# Patient Record
Sex: Female | Born: 1975 | Race: White | Hispanic: No | Marital: Married | Smoking: Never smoker
Health system: Southern US, Community
[De-identification: ages and names within clinical notes are randomized; demographics above are authoritative.]

## PROBLEM LIST (undated history)

## (undated) DIAGNOSIS — O24419 Gestational diabetes mellitus in pregnancy, unspecified control: Secondary | ICD-10-CM

## (undated) DIAGNOSIS — L709 Acne, unspecified: Secondary | ICD-10-CM

## (undated) DIAGNOSIS — H52209 Unspecified astigmatism, unspecified eye: Secondary | ICD-10-CM

## (undated) DIAGNOSIS — E669 Obesity, unspecified: Secondary | ICD-10-CM

## (undated) DIAGNOSIS — E282 Polycystic ovarian syndrome: Secondary | ICD-10-CM

## (undated) DIAGNOSIS — H5210 Myopia, unspecified eye: Secondary | ICD-10-CM

## (undated) HISTORY — DX: Acne, unspecified: L70.9

## (undated) HISTORY — DX: Gestational diabetes mellitus in pregnancy, unspecified control: O24.419

## (undated) HISTORY — DX: Obesity, unspecified: E66.9

## (undated) HISTORY — DX: Polycystic ovarian syndrome: E28.2

## (undated) HISTORY — DX: Myopia, unspecified eye: H52.10

## (undated) HISTORY — DX: Unspecified astigmatism, unspecified eye: H52.209

---

## 2002-03-13 HISTORY — PX: DILATION AND CURETTAGE OF UTERUS: SHX78

## 2009-06-29 ENCOUNTER — Ambulatory Visit: Payer: Self-pay

## 2009-07-16 ENCOUNTER — Encounter: Payer: Self-pay | Admitting: Obstetrics & Gynecology

## 2009-08-11 HISTORY — PX: TUBAL LIGATION: SHX77

## 2009-08-17 ENCOUNTER — Ambulatory Visit: Payer: Self-pay | Admitting: Unknown Physician Specialty

## 2009-08-18 ENCOUNTER — Inpatient Hospital Stay: Payer: Self-pay | Admitting: Unknown Physician Specialty

## 2016-09-21 ENCOUNTER — Other Ambulatory Visit: Payer: Self-pay | Admitting: Certified Nurse Midwife

## 2016-09-21 DIAGNOSIS — Z1231 Encounter for screening mammogram for malignant neoplasm of breast: Secondary | ICD-10-CM

## 2016-11-08 ENCOUNTER — Encounter: Payer: Self-pay | Admitting: Certified Nurse Midwife

## 2016-11-08 ENCOUNTER — Ambulatory Visit
Admission: RE | Admit: 2016-11-08 | Discharge: 2016-11-08 | Disposition: A | Discharge status: Home / Self Care | Payer: BC Managed Care – PPO | Source: Ambulatory Visit | Attending: Certified Nurse Midwife | Admitting: Certified Nurse Midwife

## 2016-11-08 ENCOUNTER — Ambulatory Visit (INDEPENDENT_AMBULATORY_CARE_PROVIDER_SITE_OTHER): Payer: BC Managed Care – PPO | Admitting: Certified Nurse Midwife

## 2016-11-08 VITALS — BP 122/82 | HR 88 | Ht 60.0 in | Wt 202.0 lb

## 2016-11-08 DIAGNOSIS — E669 Obesity, unspecified: Secondary | ICD-10-CM | POA: Diagnosis not present

## 2016-11-08 DIAGNOSIS — N923 Ovulation bleeding: Secondary | ICD-10-CM

## 2016-11-08 DIAGNOSIS — Z124 Encounter for screening for malignant neoplasm of cervix: Secondary | ICD-10-CM

## 2016-11-08 DIAGNOSIS — Z1231 Encounter for screening mammogram for malignant neoplasm of breast: Secondary | ICD-10-CM | POA: Insufficient documentation

## 2016-11-08 DIAGNOSIS — E282 Polycystic ovarian syndrome: Secondary | ICD-10-CM | POA: Diagnosis not present

## 2016-11-08 DIAGNOSIS — Z8632 Personal history of gestational diabetes: Secondary | ICD-10-CM | POA: Diagnosis not present

## 2016-11-08 DIAGNOSIS — Z01419 Encounter for gynecological examination (general) (routine) without abnormal findings: Secondary | ICD-10-CM

## 2016-11-08 NOTE — Progress Notes (Signed)
Gynecology Annual Exam  PCP: Patient, No Pcp Per  Chief Complaint:  Chief Complaint  Patient presents with  . Gynecologic Exam    History of Present Illness:Joniece D. Wah presents today for her annual exam. She is a 41 year old Caucasian/White female, G3 P2012, whose LMP was 10/03/2016. She is having no significant GYN problems.  Her menses are usually regular. They occur every  month , they last 4 days , are medium flow, and are without clots.  She has had some spotting/ brown discharge about once a month x1 day usually midcycle. Denies itching or irritation accompanying the discharge.  She denies dysmenorrhea.  The patient's past medical history is notable for PCOS, obesity, and GDM. Since her last annual GYN exam dated 10/09/2015, she has had no significant changes in her health history.  She is sexually active. She is currently using permanent sterilization for contraception.  Her most recent pap smear was obtained 10/09/2015 and was NIL/ neg HRHPV Her most recent mammogram obtained today and results are pending. Last prior mammogram was 11/09/2015 was negative. There is a positive history of breast cancer in her maternal grandmother and paternal grandmother. Genetic testing has not been done.  There is no family history of ovarian cancer.  The patient does do monthly self breast exams.  The patient does not smoke.  The patient does drink infrequently.  The patient does not use illegal drugs.  The patient exercises three times a week at the gym and walks the track.  The patient does get adequate calcium in her diet.  She had a recent cholesterol screen in 2015 that was normal.    The patient denies current symptoms of depression.    Review of Systems: Review of Systems  Constitutional: Negative for chills, fever and weight loss.       Positive for weight gain of 10# in 1 year.  HENT: Negative for congestion, sinus pain and sore throat.   Eyes: Negative for blurred vision  and pain.  Respiratory: Negative for hemoptysis, shortness of breath and wheezing.   Cardiovascular: Negative for chest pain, palpitations and leg swelling.  Gastrointestinal: Negative for abdominal pain, blood in stool, diarrhea, heartburn, nausea and vomiting.  Genitourinary: Negative for dysuria, frequency, hematuria and urgency.  Musculoskeletal: Negative for back pain, joint pain and myalgias.  Skin: Negative for itching and rash.  Neurological: Negative for dizziness, tingling and headaches.  Endo/Heme/Allergies: Negative for environmental allergies and polydipsia. Does not bruise/bleed easily.       Positive  for hirsutism and acne   Psychiatric/Behavioral: Negative for depression. The patient is not nervous/anxious and does not have insomnia.     Past Medical History:  Past Medical History:  Diagnosis Date  . Gestational diabetes   . Obesity (BMI 35.0-39.9 without comorbidity)   . PCOS (polycystic ovarian syndrome)     Past Surgical History:  Past Surgical History:  Procedure Laterality Date  . CESAREAN SECTION     2003,2011  . DILATION AND CURETTAGE OF UTERUS  03/2002  . TUBAL LIGATION  08/2009    Family History:  Family History  Problem Relation Age of Onset  . Breast cancer Maternal Grandmother        60's  . Breast cancer Paternal Grandmother        early 43's  . Lung cancer Mother 46  . Hypertension Maternal Uncle   . Melanoma Maternal Grandfather        deceased in his 39s  .  Leukemia Paternal Grandfather [id number]    Social History:  Social History   Social History  . Marital status: Married    Spouse name: Aurther Lofterry  . Number of children: 2  . Years of education: 16   Occupational History  . Teacher    Social History Main Topics  . Smoking status: Never Smoker  . Smokeless tobacco: Never Used  . Alcohol use Yes     Comment: occasional  . Drug use: No  . Sexual activity: Yes    Birth control/ protection: Surgical   Other Topics Concern  . Not on  file   Social History Narrative  . No narrative on file    Allergies:  No Known Allergies  Medications:  Current Outpatient Prescriptions:  .  calcium-vitamin D (OSCAL WITH D) 500-200 MG-UNIT tablet, Take 1 tablet by mouth daily with breakfast., Disp: , Rfl:  .  Multiple Vitamin (MULTIVITAMIN) tablet, Take 1 tablet by mouth daily., Disp: , Rfl:  Essential oils   Physical Exam Vitals: Blood pressure 122/82, pulse 88, height 5' (1.524 m), weight 202 lb (91.6 kg), BMI 39.45 kg/m2 last menstrual period 10/03/2016.  General: pleasant WF in NAD HEENT: normocephalic, anicteric  Face: hirsutism of upper lip, chin/ acne chin and neck Neck: no thyroid enlargement, no palpable nodules, no cervical lymphadenopathy  Pulmonary: No increased work of breathing, CTAB Cardiovascular: RRR, without murmur  Breast: Breast symmetrical, no tenderness, no palpable nodules or masses, no skin or nipple retraction present, no nipple discharge.  No axillary, infraclavicular or supraclavicular lymphadenopathy. Abdomen: Soft, non-tender, non-distended.  Umbilicus without lesions.  No hepatomegaly or masses palpable. No evidence of hernia. Genitourinary:  External: Normal external female genitalia.  Normal urethral meatus, normal  Bartholin's and Skene's glands.    Vagina: Normal vaginal mucosa, no evidence of prolapse.    Cervix: Grossly normal in appearance, no bleeding, nullip, non-tender  Uterus: Anteverted, normal size, shape, and consistency, mobile, and non-tender  Adnexa: No adnexal masses, non-tender (diificult exam due to obesity)  Rectal: deferred  Lymphatic: no evidence of inguinal lymphadenopathy Extremities: no edema, erythema, or tenderness Neurologic: Grossly intact Psychiatric: mood appropriate, affect full     Assessment: 41 y.o. [id number] well woman exam PCOS Intermenstrual spotting-midcycle, possibly related to ovulation  Plan:  1) Breast cancer screening - recommend monthly self  breast exams and annual mammograms. Mammogram done today  2) Menstrual calendar-to see if intermenstrual spotting is consistently 2 weeks before next menses starts. To let me know if this is not the case.  3) Cervical cancer screening - Pap was done. ASCCP guidelines and rational discussed.  Patient opts for yearly screening interval  4) Routine healthcare maintenance including cholesterol and diabetes screening UTD   5) RTO 1 year for annual and prn  Farrel Connersolleen Adryana Mogensen, CNM

## 2016-11-11 LAB — IGP,RFX APTIMA HPV ALL PTH: PAP SMEAR COMMENT: 0

## 2016-11-14 ENCOUNTER — Inpatient Hospital Stay
Admission: RE | Admit: 2016-11-14 | Discharge: 2016-11-14 | Disposition: A | Discharge status: Home / Self Care | Payer: Self-pay | Source: Ambulatory Visit | Attending: *Deleted | Admitting: *Deleted

## 2016-11-14 ENCOUNTER — Other Ambulatory Visit: Payer: Self-pay | Admitting: *Deleted

## 2016-11-14 DIAGNOSIS — Z9289 Personal history of other medical treatment: Secondary | ICD-10-CM

## 2017-12-12 ENCOUNTER — Other Ambulatory Visit: Payer: Self-pay | Admitting: Certified Nurse Midwife

## 2017-12-12 DIAGNOSIS — Z1231 Encounter for screening mammogram for malignant neoplasm of breast: Secondary | ICD-10-CM

## 2018-01-02 ENCOUNTER — Ambulatory Visit
Admission: RE | Admit: 2018-01-02 | Discharge: 2018-01-02 | Disposition: A | Discharge status: Home / Self Care | Payer: 59 | Source: Ambulatory Visit | Attending: Certified Nurse Midwife | Admitting: Certified Nurse Midwife

## 2018-01-02 DIAGNOSIS — Z1231 Encounter for screening mammogram for malignant neoplasm of breast: Secondary | ICD-10-CM | POA: Diagnosis present

## 2018-01-03 ENCOUNTER — Other Ambulatory Visit: Payer: Self-pay | Admitting: Certified Nurse Midwife

## 2018-01-03 DIAGNOSIS — N6310 Unspecified lump in the right breast, unspecified quadrant: Secondary | ICD-10-CM

## 2018-01-03 DIAGNOSIS — R928 Other abnormal and inconclusive findings on diagnostic imaging of breast: Secondary | ICD-10-CM

## 2018-01-10 ENCOUNTER — Ambulatory Visit
Admission: RE | Admit: 2018-01-10 | Discharge: 2018-01-10 | Disposition: A | Discharge status: Home / Self Care | Payer: 59 | Source: Ambulatory Visit | Attending: Certified Nurse Midwife | Admitting: Certified Nurse Midwife

## 2018-01-10 DIAGNOSIS — N6310 Unspecified lump in the right breast, unspecified quadrant: Secondary | ICD-10-CM

## 2018-01-10 DIAGNOSIS — R928 Other abnormal and inconclusive findings on diagnostic imaging of breast: Secondary | ICD-10-CM | POA: Diagnosis not present

## 2018-01-12 ENCOUNTER — Ambulatory Visit (INDEPENDENT_AMBULATORY_CARE_PROVIDER_SITE_OTHER): Payer: BC Managed Care – PPO | Admitting: Certified Nurse Midwife

## 2018-01-12 ENCOUNTER — Other Ambulatory Visit (HOSPITAL_COMMUNITY)
Admission: RE | Admit: 2018-01-12 | Discharge: 2018-01-12 | Disposition: A | Discharge status: Home / Self Care | Payer: 59 | Source: Ambulatory Visit | Attending: Obstetrics and Gynecology | Admitting: Obstetrics and Gynecology

## 2018-01-12 ENCOUNTER — Encounter: Payer: Self-pay | Admitting: Certified Nurse Midwife

## 2018-01-12 VITALS — BP 140/84 | HR 84 | Ht 60.0 in | Wt 202.2 lb

## 2018-01-12 DIAGNOSIS — Z01419 Encounter for gynecological examination (general) (routine) without abnormal findings: Secondary | ICD-10-CM | POA: Diagnosis not present

## 2018-01-12 DIAGNOSIS — Z124 Encounter for screening for malignant neoplasm of cervix: Secondary | ICD-10-CM

## 2018-01-12 DIAGNOSIS — L680 Hirsutism: Secondary | ICD-10-CM | POA: Diagnosis not present

## 2018-01-12 DIAGNOSIS — N6310 Unspecified lump in the right breast, unspecified quadrant: Secondary | ICD-10-CM | POA: Diagnosis not present

## 2018-01-12 DIAGNOSIS — Z01411 Encounter for gynecological examination (general) (routine) with abnormal findings: Secondary | ICD-10-CM | POA: Diagnosis not present

## 2018-01-12 NOTE — Progress Notes (Addendum)
Gynecology Annual Exam  PCP: [facility], [place]  Chief Complaint:  Chief Complaint  Patient presents with  . Gynecologic Exam    History of Present Illness:Danielle Mitchell presents today for her annual exam. She is a 42 year old Caucasian/White female, G3 P2012, whose LMP was 12/31/2017. She is having no significant GYN problems.  Her menses are usually regular. They occur every  month , they last 3-4 days , are medium flow, and are with occ small clots.  She has some discharge at times. Denies itching or irritation or odor accompanying the discharge.  She denies dysmenorrhea.  The patient's past medical history is notable for PCOS, obesity, and GDM. Since her last annual GYN exam dated 11/08/2016 , she has had no significant changes in her health history.  She is sexually active. She is currently using permanent sterilization for contraception.  Her most recent pap smear was obtained 11/08/2016  and was NIL. Her most recent mammogram 01/02/2018 was Birads 0. Additional views and ultrasound of the right breast revealed a 6x8x5mm mass at 6 o'clock-most likely a cyst. Repeat diagnostic mammogram and ultrasound recommended in 6 mos.  There is a positive history of breast cancer in her maternal grandmother and paternal grandmother. Genetic testing has not been done.  There is no family history of ovarian cancer.  The patient does do monthly self breast exams.  The patient does not smoke.  The patient does drink infrequently.  The patient does not use illegal drugs.  The patient normally exercises three times a week at the gym and walks the track. She has not been as active the past 2 months, but is wanting to get back to exercising soon. The patient does get adequate calcium in her diet.  She had a recent cholesterol screen in 2015 that was normal.    The patient denies current symptoms of depression.    Review of Systems: Review of Systems  Constitutional: Negative for chills,  fever and weight loss.  HENT: Negative for congestion, sinus pain and sore throat.   Eyes: Negative for blurred vision and pain.  Respiratory: Negative for hemoptysis, shortness of breath and wheezing.   Cardiovascular: Negative for chest pain, palpitations and leg swelling.  Gastrointestinal: Negative for abdominal pain, blood in stool, diarrhea, heartburn, nausea and vomiting.  Genitourinary: Negative for dysuria, frequency, hematuria and urgency.  Musculoskeletal: Negative for back pain, joint pain and myalgias.  Skin: Negative for itching and rash.  Neurological: Negative for dizziness, tingling and headaches.  Endo/Heme/Allergies: Negative for environmental allergies and polydipsia. Does not bruise/bleed easily.       Positive  for hirsutism and acne   Psychiatric/Behavioral: Negative for depression. The patient is not nervous/anxious and does not have insomnia.     Past Medical History:  Past Medical History:  Diagnosis Date  . Acne   . Astigmatism   . Gestational diabetes   . Nearsightedness    glasses  . Obesity (BMI 35.0-39.9 without comorbidity)   . PCOS (polycystic ovarian syndrome)     Past Surgical History:  Past Surgical History:  Procedure Laterality Date  . CESAREAN SECTION     2003,2011  . DILATION AND CURETTAGE OF UTERUS  03/2002  . TUBAL LIGATION  08/2009    Family History:  Family History  Problem Relation Age of Onset  . Breast cancer Maternal Grandmother        60's  . Breast cancer Paternal Grandmother  early 55's; unsure of primary-found in lung  . Lung cancer Mother 45  . Cancer Mother        skin  . Hypertension Maternal Uncle   . Melanoma Maternal Grandfather        deceased in his 13s  . Leukemia Paternal Grandfather 8    Social History:  Social History   Socioeconomic History  . Marital status: Married    Spouse name: Aurther Loft  . Number of children: 2  . Years of education: 16  . Highest education level: Bachelor's degree  (e.g., BA, AB, BS)  Occupational History  . Occupation: Teacher    Comment: [employer]  Social Needs  . Financial resource strain: Not on file  . Food insecurity:    Worry: Not on file    Inability: Not on file  . Transportation needs:    Medical: Not on file    Non-medical: Not on file  Tobacco Use  . Smoking status: Never Smoker  . Smokeless tobacco: Never Used  Substance and Sexual Activity  . Alcohol use: Yes    Comment: occasional  . Drug use: No  . Sexual activity: Yes    Partners: Male    Birth control/protection: Surgical  Lifestyle  . Physical activity:    Days per week: Not on file    Minutes per session: Not on file  . Stress: Not on file  Relationships  . Social connections:    Talks on phone: Not on file    Gets together: Not on file    Attends religious service: Not on file    Active member of club or organization: Not on file    Attends meetings of clubs or organizations: Not on file    Relationship status: Not on file  . Intimate partner violence:    Fear of current or ex partner: Not on file    Emotionally abused: Not on file    Physically abused: Not on file    Forced sexual activity: Not on file  Other Topics Concern  . Not on file  Social History Narrative  . Not on file    Allergies:  No Known Allergies  Medications: Essential oils   Physical Exam Vitals: BP 140/84   Pulse 84   Ht 5' (1.524 m)   Wt 202 lb 4 oz (91.7 kg)   LMP 12/31/2017   BMI 39.50 kg/m     General: pleasant WF in NAD HEENT: normocephalic, anicteric  Face: hirsutism of upper lip, chin/ acne chin and neck Neck: no thyroid enlargement, no palpable nodules, no cervical lymphadenopathy  Pulmonary: No increased work of breathing, CTAB Cardiovascular: RRR, with Grade II/VI systolic murmur in all areas Breast: Breast symmetrical, no tenderness, no palpable nodules or masses, no skin or nipple retraction present, no nipple discharge.  No axillary, infraclavicular or  supraclavicular lymphadenopathy. Abdomen: Soft, non-tender, non-distended.  Umbilicus without lesions.  No hepatomegaly or masses palpable. No evidence of hernia. Genitourinary:  External: Normal external female genitalia.  Normal urethral meatus, normal  Bartholin's and Skene's glands.    Vagina: Normal vaginal mucosa, no evidence of prolapse.    Cervix: Grossly normal in appearance, no bleeding, nullip, non-tender  Uterus: Anteverted, normal size, shape, and consistency, mobile, and non-tender  Adnexa: No adnexal masses, non-tender (diificult exam due to obesity)  Rectal: deferred  Lymphatic: no evidence of inguinal lymphadenopathy Extremities: no edema, erythema, or tenderness Neurologic: Grossly intact Psychiatric: mood appropriate, affect full     Assessment: 42  y.o. [id number] well woman exam PCOS Birads 3 mammogram-probable right breast cyst   Plan:  1) Breast cancer screening - recommend monthly self breast exams. Repeat right diagnostic mammogram and ultrasound in 6 months.  2) Cervical cancer screening - Pap was done. ASCCP guidelines and rational discussed.  Patient opts for yearly screening interval  3) Routine healthcare maintenance including cholesterol and diabetes screening UTD . Screening due next year.  4) RTO 1 year for annual and prn  Farrel Connersolleen Kateline Kinkade, CNM

## 2018-01-13 ENCOUNTER — Encounter: Payer: Self-pay | Admitting: Certified Nurse Midwife

## 2018-01-16 LAB — CYTOLOGY - PAP: DIAGNOSIS: NEGATIVE

## 2018-03-03 ENCOUNTER — Encounter: Payer: Self-pay | Admitting: Emergency Medicine

## 2018-03-03 ENCOUNTER — Other Ambulatory Visit: Payer: Self-pay

## 2018-03-03 ENCOUNTER — Emergency Department
Admission: EM | Admit: 2018-03-03 | Discharge: 2018-03-03 | Disposition: A | Discharge status: Home / Self Care | Payer: BC Managed Care – PPO | Attending: Student in an Organized Health Care Education/Training Program | Admitting: Student in an Organized Health Care Education/Training Program

## 2018-03-03 DIAGNOSIS — Y9389 Activity, other specified: Secondary | ICD-10-CM | POA: Diagnosis not present

## 2018-03-03 DIAGNOSIS — S50811A Abrasion of right forearm, initial encounter: Secondary | ICD-10-CM | POA: Diagnosis not present

## 2018-03-03 DIAGNOSIS — S50812A Abrasion of left forearm, initial encounter: Secondary | ICD-10-CM | POA: Insufficient documentation

## 2018-03-03 DIAGNOSIS — S59911A Unspecified injury of right forearm, initial encounter: Secondary | ICD-10-CM | POA: Insufficient documentation

## 2018-03-03 DIAGNOSIS — Z23 Encounter for immunization: Secondary | ICD-10-CM | POA: Insufficient documentation

## 2018-03-03 DIAGNOSIS — Y92410 Unspecified street and highway as the place of occurrence of the external cause: Secondary | ICD-10-CM | POA: Diagnosis not present

## 2018-03-03 DIAGNOSIS — S1081XA Abrasion of other specified part of neck, initial encounter: Secondary | ICD-10-CM | POA: Diagnosis not present

## 2018-03-03 DIAGNOSIS — T07XXXA Unspecified multiple injuries, initial encounter: Secondary | ICD-10-CM

## 2018-03-03 DIAGNOSIS — S59912A Unspecified injury of left forearm, initial encounter: Secondary | ICD-10-CM | POA: Diagnosis present

## 2018-03-03 DIAGNOSIS — Y999 Unspecified external cause status: Secondary | ICD-10-CM | POA: Insufficient documentation

## 2018-03-03 MED ORDER — BACITRACIN-NEOMYCIN-POLYMYXIN 400-5-5000 EX OINT: TOPICAL_OINTMENT | Freq: Once | CUTANEOUS | Status: DC

## 2018-03-03 MED ORDER — BACITRACIN ZINC 500 UNIT/GM EX OINT: TOPICAL_OINTMENT | Freq: Once | CUTANEOUS | Status: DC

## 2018-03-03 MED ORDER — BACITRACIN-NEOMYCIN-POLYMYXIN 400-5-5000 EX OINT
TOPICAL_OINTMENT | CUTANEOUS | Status: AC
  Filled 2018-03-03: qty 1

## 2018-03-03 MED ORDER — TETANUS-DIPHTH-ACELL PERTUSSIS 5-2.5-18.5 LF-MCG/0.5 IM SUSP
0.5000 mL | Freq: Once | INTRAMUSCULAR | Status: AC
  Administered 2018-03-03: 0.5 mL via INTRAMUSCULAR
  Filled 2018-03-03: qty 0.5

## 2018-03-03 MED ORDER — BACIT-POLY-NEO HC 1 % EX OINT: TOPICAL_OINTMENT | Freq: Once | CUTANEOUS | Status: DC

## 2018-03-03 NOTE — ED Notes (Signed)
Neosporin applied to abrasions.
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
## 2018-03-03 NOTE — Discharge Instructions (Addendum)
Follow-up with your primary care provider or [facility] if any continued problems.  Clean the areas with mild soap and water and watch for any signs of infection.  You may apply a small amount of Neosporin or bacitracin to the abrasions for the next 1 to 2 days.  After that allow areas to air dry.  You may take Tylenol or ibuprofen as needed for muscle aches or pain.  You may also apply ice packs to your forearms which will help with the abrasions and pain as a result of the abrasions.
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
## 2018-06-14 ENCOUNTER — Other Ambulatory Visit: Payer: Self-pay | Admitting: Certified Nurse Midwife

## 2018-06-14 DIAGNOSIS — N6001 Solitary cyst of right breast: Secondary | ICD-10-CM

## 2018-06-14 DIAGNOSIS — R928 Other abnormal and inconclusive findings on diagnostic imaging of breast: Secondary | ICD-10-CM

## 2018-06-20 ENCOUNTER — Telehealth: Payer: Self-pay

## 2018-06-20 NOTE — Telephone Encounter (Signed)
Pt needs additional testing at [facility].  Needs order sent.  [id number]

## 2018-06-20 NOTE — Telephone Encounter (Signed)
Orders are in. Left message for patient to call [facility] to schedule right diagnostic mammogram and ultrasound

## 2018-07-20 ENCOUNTER — Ambulatory Visit
Admission: RE | Admit: 2018-07-20 | Discharge: 2018-07-20 | Disposition: A | Discharge status: Home / Self Care | Payer: BC Managed Care – PPO | Source: Ambulatory Visit | Attending: Certified Nurse Midwife | Admitting: Certified Nurse Midwife

## 2018-07-20 DIAGNOSIS — N6001 Solitary cyst of right breast: Secondary | ICD-10-CM

## 2018-07-20 DIAGNOSIS — R928 Other abnormal and inconclusive findings on diagnostic imaging of breast: Secondary | ICD-10-CM | POA: Diagnosis present

## 2018-07-23 ENCOUNTER — Other Ambulatory Visit: Payer: Self-pay | Admitting: Certified Nurse Midwife

## 2018-07-23 DIAGNOSIS — N6310 Unspecified lump in the right breast, unspecified quadrant: Secondary | ICD-10-CM

## 2018-07-23 DIAGNOSIS — Z1231 Encounter for screening mammogram for malignant neoplasm of breast: Secondary | ICD-10-CM

## 2018-10-04 IMAGING — US US BREAST*R* LIMITED INC AXILLA
1 series · 5 of 5 positions shown · non-contrast
Comparison: Previous exam(s).

CLINICAL DATA: The patient was called back for a right breast mass.

EXAM:
DIGITAL DIAGNOSTIC RIGHT MAMMOGRAM
ULTRASOUND RIGHT BREAST

[Series 1: us breast*right* limited inc axilla · 0.06mm/px · 5 of 5 slices shown]
[im 1/5]
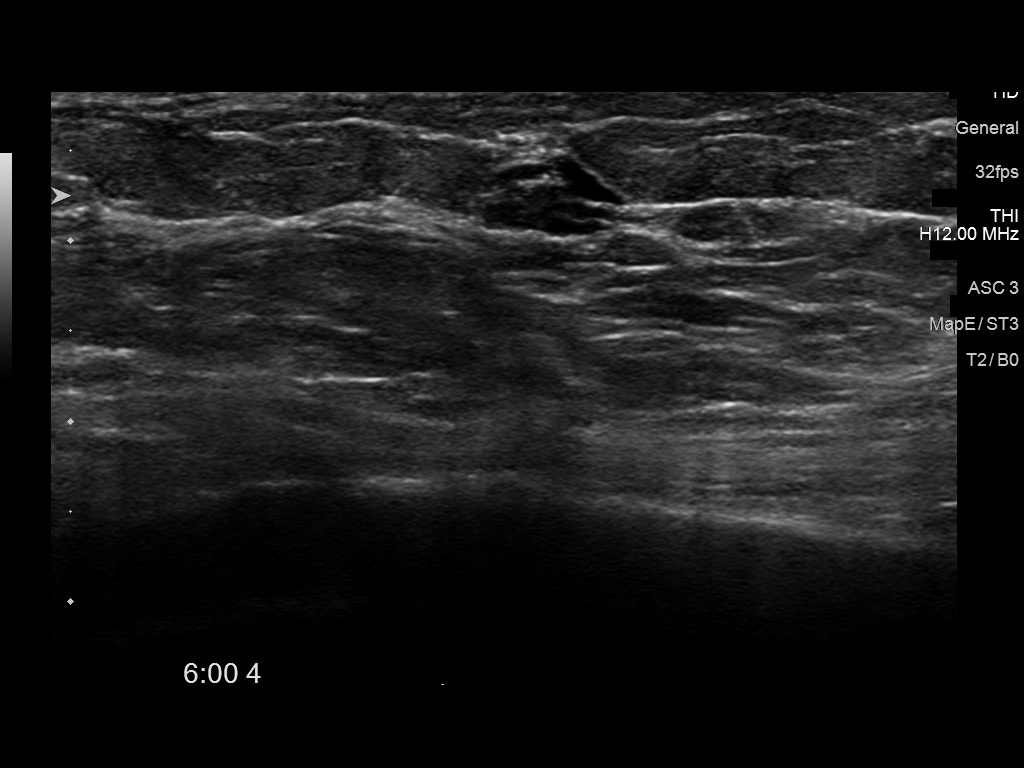
[im 2/5]
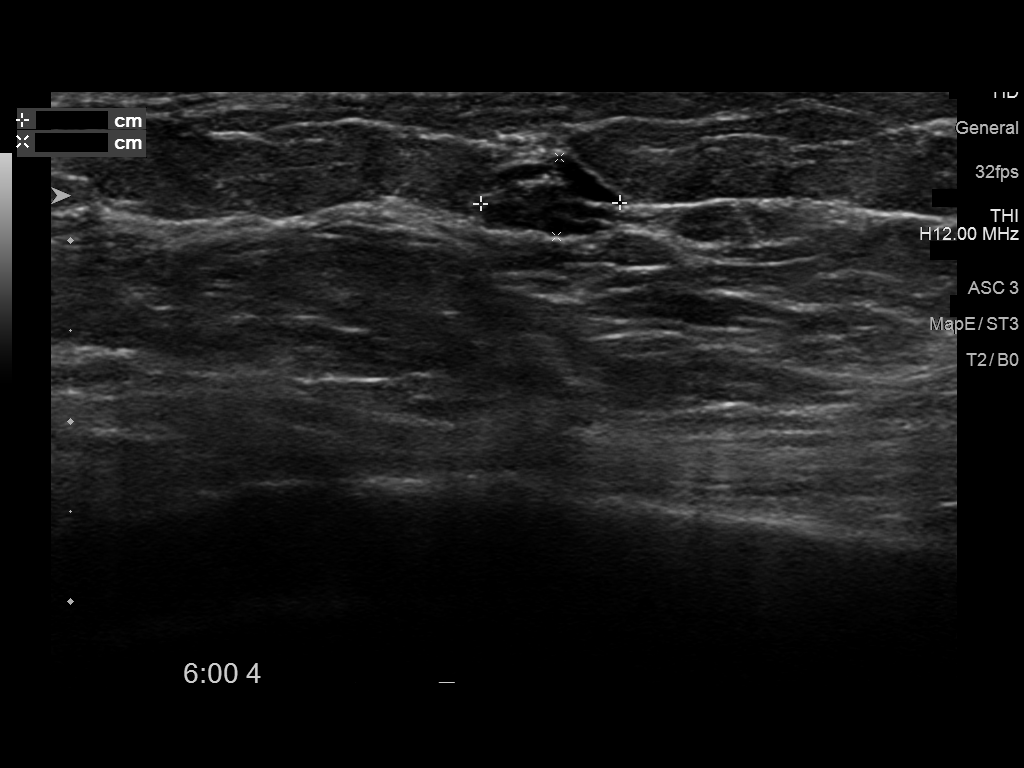
[im 3/5]
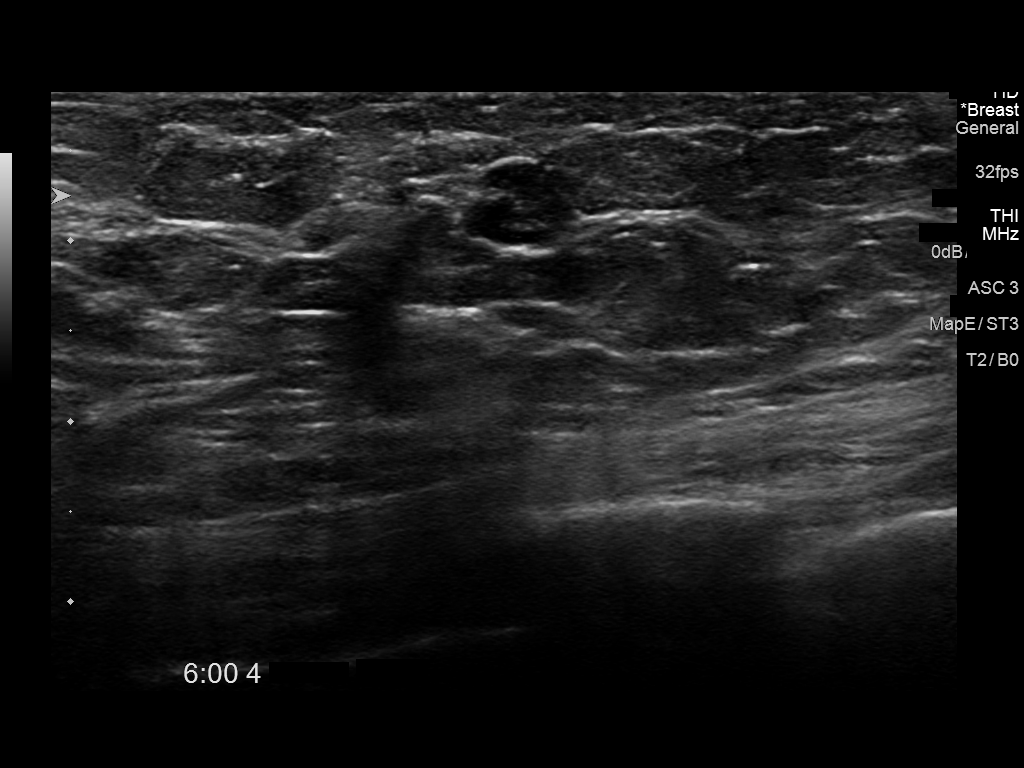
[im 4/5]
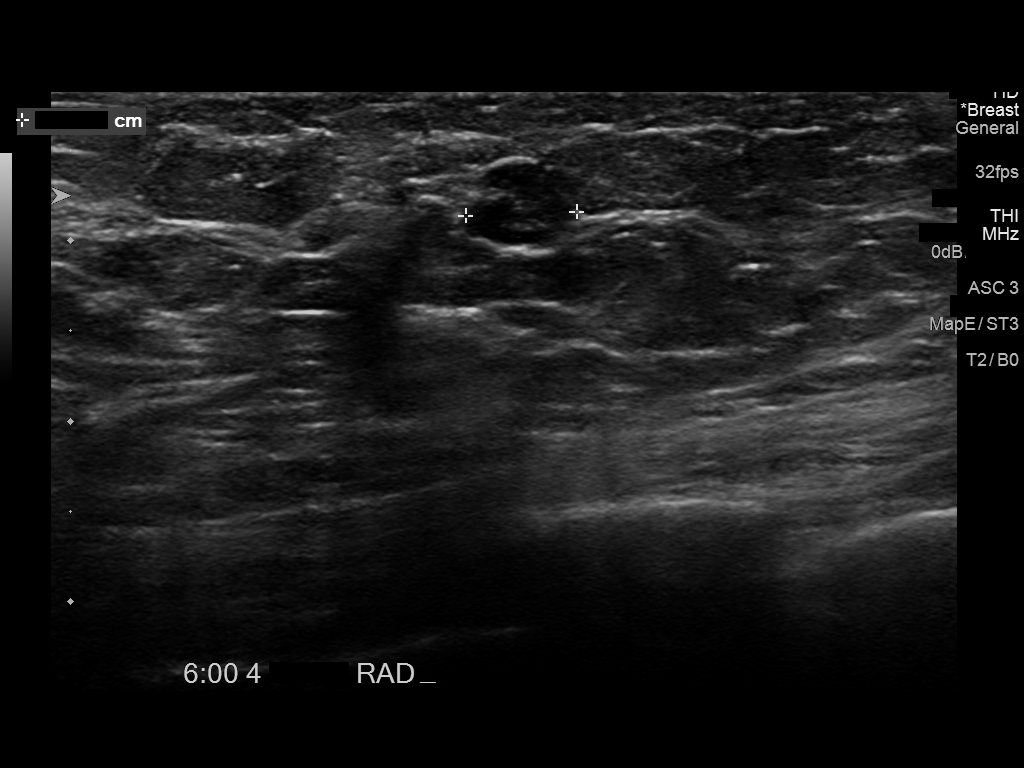
[im 5/5]
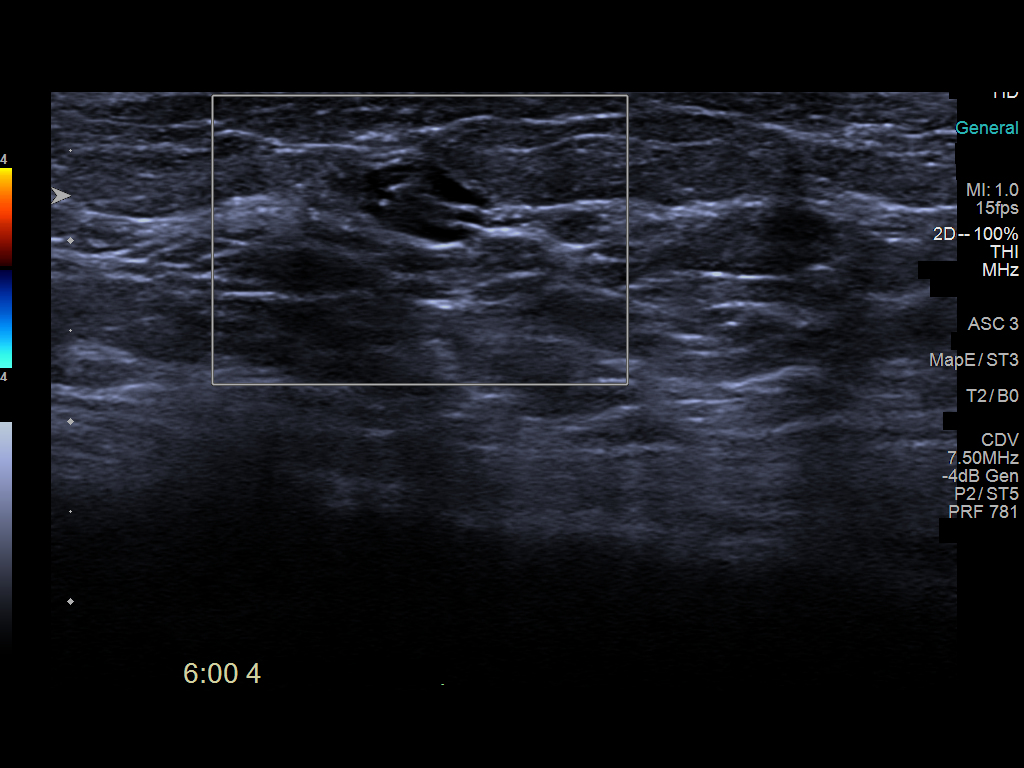

[5 of 5 positions shown; findings below may reference images not displayed]

ACR Breast Density Category b: There are scattered areas of
fibroglandular density.
FINDINGS: The mass in the inferior right breast persists on additional
imaging.

On physical exam, no suspicious lumps identified.

Targeted ultrasound is performed, showing a probable cluster of
cysts in the right breast at 6 o'clock, 4 cm from the nipple
measuring 6 x 8 x 4 mm.
IMPRESSION: Probably benign right breast at 6 o'clock in the right breast,
likely a cluster of cysts.

RECOMMENDATION:
Six-month follow-up mammogram and ultrasound of the probably benign
right breast mass.

I have discussed the findings and recommendations with the patient.
Results were also provided in writing at the conclusion of the
visit. If applicable, a reminder letter will be sent to the patient
regarding the next appointment.

BI-RADS CATEGORY  3: Probably benign.

## 2019-01-14 ENCOUNTER — Encounter: Payer: Self-pay | Admitting: Obstetrics and Gynecology

## 2019-01-14 ENCOUNTER — Other Ambulatory Visit: Payer: Self-pay

## 2019-01-14 ENCOUNTER — Ambulatory Visit (INDEPENDENT_AMBULATORY_CARE_PROVIDER_SITE_OTHER): Payer: BC Managed Care – PPO | Admitting: Obstetrics and Gynecology

## 2019-01-14 VITALS — BP 130/86 | HR 87 | Ht 60.0 in | Wt 207.0 lb

## 2019-01-14 DIAGNOSIS — N926 Irregular menstruation, unspecified: Secondary | ICD-10-CM

## 2019-01-14 DIAGNOSIS — Z131 Encounter for screening for diabetes mellitus: Secondary | ICD-10-CM

## 2019-01-14 DIAGNOSIS — Z130 Encounter for screening for diseases of the blood and blood-forming organs and certain disorders involving the immune mechanism: Secondary | ICD-10-CM

## 2019-01-14 DIAGNOSIS — Z13220 Encounter for screening for lipoid disorders: Secondary | ICD-10-CM

## 2019-01-14 DIAGNOSIS — Z01419 Encounter for gynecological examination (general) (routine) without abnormal findings: Secondary | ICD-10-CM | POA: Diagnosis not present

## 2019-01-14 DIAGNOSIS — Z1329 Encounter for screening for other suspected endocrine disorder: Secondary | ICD-10-CM

## 2019-01-14 DIAGNOSIS — Z1239 Encounter for other screening for malignant neoplasm of breast: Secondary | ICD-10-CM

## 2019-01-14 DIAGNOSIS — Z0000 Encounter for general adult medical examination without abnormal findings: Secondary | ICD-10-CM

## 2019-01-14 DIAGNOSIS — N6310 Unspecified lump in the right breast, unspecified quadrant: Secondary | ICD-10-CM

## 2019-01-14 NOTE — Progress Notes (Addendum)
Gynecology Annual Exam  [facility] [place]  Chief Complaint:  Chief Complaint  Patient presents with  . Gynecologic Exam    History of Present Illness: Patient is a 43 y.o. [id number] presents for annual exam. The patient has no complaints today.   LMP: No LMP recorded. Average Interval: regular, 28 days Duration of flow: 3 days Heavy Menses: no Clots: no Intermenstrual Bleeding: yes Postcoital Bleeding: no Dysmenorrhea: no  Reports spotting between periods.    The patient is sexually active. She currently uses tubal ligation for contraception. She denies dyspareunia.  The patient does not perform self breast exams.  There is no notable family history of breast or ovarian cancer in her family.  The patient wears seatbelts: yes.   The patient has regular exercise: walking.    The patient denies current symptoms of depression.    Review of Systems: ROS  Past Medical History:  Past Medical History:  Diagnosis Date  . Acne   . Astigmatism   . Gestational diabetes   . Nearsightedness    glasses  . Obesity (BMI 35.0-39.9 without comorbidity)   . PCOS (polycystic ovarian syndrome)     Past Surgical History:  Past Surgical History:  Procedure Laterality Date  . CESAREAN SECTION     2003,2011  . DILATION AND CURETTAGE OF UTERUS  03/2002  . TUBAL LIGATION  08/2009    Gynecologic History:  No LMP recorded. Contraception: tubal ligation Last Pap: Results were: 2019 NIL  Last mammogram: February  Results were: BI-RAD III  Obstetric History: [id number]: G3P2012  Family History:  Family History  Problem Relation Age of Onset  . Breast cancer Maternal Grandmother        60's  . Breast cancer Paternal Grandmother        early 750's; unsure of primary-found in lung  . Lung cancer Mother [id number]  . Cancer Mother        skin  . Hypertension Maternal Uncle   . Melanoma Maternal Grandfather        deceased in his 3140s  . Leukemia Paternal Grandfather [id number]    Social  History:  Social History   Socioeconomic History  . Marital status: Married    Spouse name: Aurther Lofterry  . Number of children: 2  . Years of education: 16  . Highest education level: Bachelor's degree (e.g., BA, AB, BS)  Occupational History  . Occupation: Teacher    Comment: [employer]  Social Needs  . Financial resource strain: Not on file  . Food insecurity    Worry: Not on file    Inability: Not on file  . Transportation needs    Medical: Not on file    Non-medical: Not on file  Tobacco Use  . Smoking status: Never Smoker  . Smokeless tobacco: Never Used  Substance and Sexual Activity  . Alcohol use: Yes    Comment: occasional  . Drug use: No  . Sexual activity: Yes    Partners: Male    Birth control/protection: Surgical  Lifestyle  . Physical activity    Days per week: Not on file    Minutes per session: Not on file  . Stress: Not on file  Relationships  . Social Musicianconnections    Talks on phone: Not on file    Gets together: Not on file    Attends religious service: Not on file    Active member of club or organization: Not on file    Attends meetings of  clubs or organizations: Not on file    Relationship status: Not on file  . Intimate partner violence    Fear of current or ex partner: Not on file    Emotionally abused: Not on file    Physically abused: Not on file    Forced sexual activity: Not on file  Other Topics Concern  . Not on file  Social History Narrative  . Not on file    Allergies:  No Known Allergies  Medications: Prior to Admission medications   Not on File    Physical Exam Vitals: There were no vitals taken for this visit.  General: NAD HEENT: normocephalic, anicteric Thyroid: no enlargement, no palpable nodules Pulmonary: No increased work of breathing, CTAB Cardiovascular: RRR, distal pulses 2+ Breast: Breast symmetrical, no tenderness, no palpable nodules or masses, no skin or nipple retraction present, no nipple discharge.  No  axillary or supraclavicular lymphadenopathy. Abdomen: NABS, soft, non-tender, non-distended.  Umbilicus without lesions.  No hepatomegaly, splenomegaly or masses palpable. No evidence of hernia  Genitourinary:  External: Normal external female genitalia.  Normal urethral meatus, normal Bartholin's and Skene's glands.    Vagina: Normal vaginal mucosa, no evidence of prolapse.  Dark red/brown blood present on exam.  Cervix: Grossly normal in appearance, no bleeding  Uterus: Non-enlarged, mobile, normal contour.  No CMT  Adnexa: ovaries non-enlarged, no adnexal masses  Rectal: deferred  Lymphatic: no evidence of inguinal lymphadenopathy Extremities: no edema, erythema, or tenderness Neurologic: Grossly intact Psychiatric: mood appropriate, affect full  Female chaperone present for pelvic and breast  portions of the physical exam    Assessment: 43 y.o. F6O1308 routine annual exam  Plan: Problem List Items Addressed This Visit    None    Visit Diagnoses    Encounter for gynecological examination    -  Primary   Mass of right breast on mammogram       Normal pelvic exam       Encounter for screening breast examination          1) Mammogram - recommend yearly screening mammogram.  Mammogram Was ordered today   2) STI screening  was offered and declined  3) ASCCP guidelines and rational discussed.  Patient opts for every 3 years screening interval  4) Contraception - the patient is currently using  tubal ligation.  She is happy with her current form of contraception and plans to continue  5) Colonoscopy -- Screening recommended starting at age 32 for average risk individuals, age 27 for individuals deemed at increased risk (including African Americans) and recommended to continue until age 19.  For patient age 12-85 individualized approach is recommended.  Gold standard screening is via colonoscopy, Cologuard screening is an acceptable alternative for patient unwilling or unable to  undergo colonoscopy.  "Colorectal cancer screening for average?risk adults: 2018 guideline update from the American Cancer Society"CA: A Cancer Journal for Clinicians: Nov 09, 2016   6) Routine healthcare maintenance including cholesterol, diabetes screening discussed To return fasting at a later date  7) Irregular uterine bleeding- will follow up of an ultrasound.   8) Return in about 1 year (around 01/14/2020) for annual.  Adrian Prows MD [facility], [facility] 01/14/2019 12:51 PM

## 2019-01-14 NOTE — Addendum Note (Signed)
Addended by: Adrian Prows on: 01/14/2019 01:41 PM   Modules accepted: Orders

## 2019-01-15 ENCOUNTER — Telehealth: Payer: Self-pay | Admitting: Obstetrics and Gynecology

## 2019-01-15 NOTE — Telephone Encounter (Signed)
Danielle Mitchell        Please scheduled the patient's ultrasound.    Patient states she will call back and schedule once she knows more of when she can come in.

## 2019-03-27 ENCOUNTER — Ambulatory Visit
Admission: RE | Admit: 2019-03-27 | Discharge: 2019-03-27 | Disposition: A | Discharge status: Home / Self Care | Payer: BC Managed Care – PPO | Source: Ambulatory Visit | Attending: Certified Nurse Midwife | Admitting: Certified Nurse Midwife

## 2019-03-27 DIAGNOSIS — Z1231 Encounter for screening mammogram for malignant neoplasm of breast: Secondary | ICD-10-CM | POA: Diagnosis present

## 2019-03-27 DIAGNOSIS — N6310 Unspecified lump in the right breast, unspecified quadrant: Secondary | ICD-10-CM

## 2019-03-28 ENCOUNTER — Encounter: Payer: Self-pay | Admitting: Certified Nurse Midwife

## 2019-08-03 IMAGING — MG MM DIGITAL DIAGNOSTIC UNILAT*R* W/ TOMO W/ CAD
4 series · 4 of 12 positions shown · non-contrast
Comparison: Previous exam(s).

CLINICAL DATA: The patient was called back for a right breast mass.

EXAM:
DIGITAL DIAGNOSTIC RIGHT MAMMOGRAM
ULTRASOUND RIGHT BREAST

[R CC synth-2D]
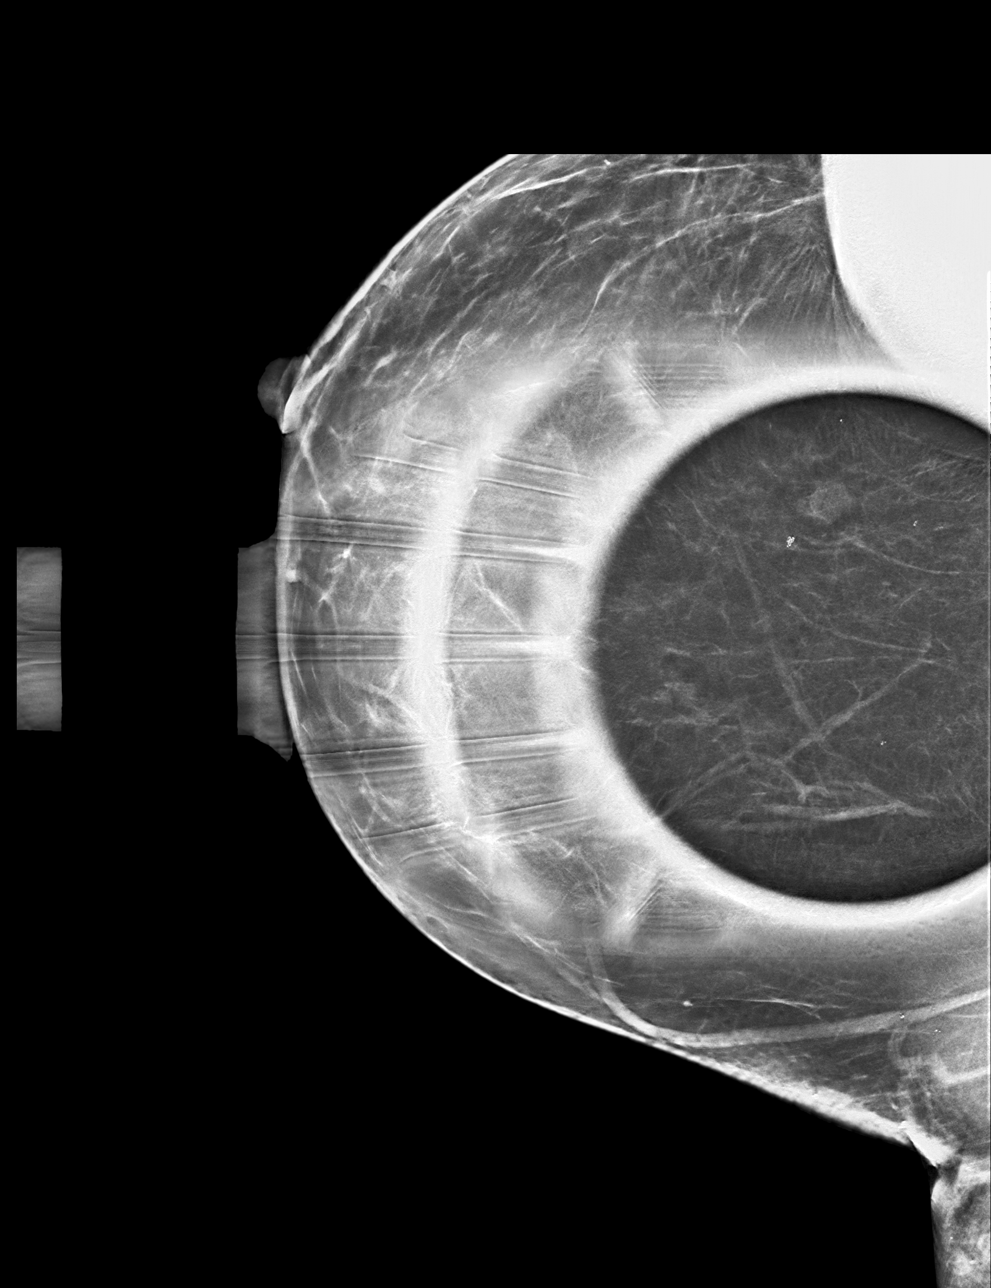

[R MLO synth-2D]
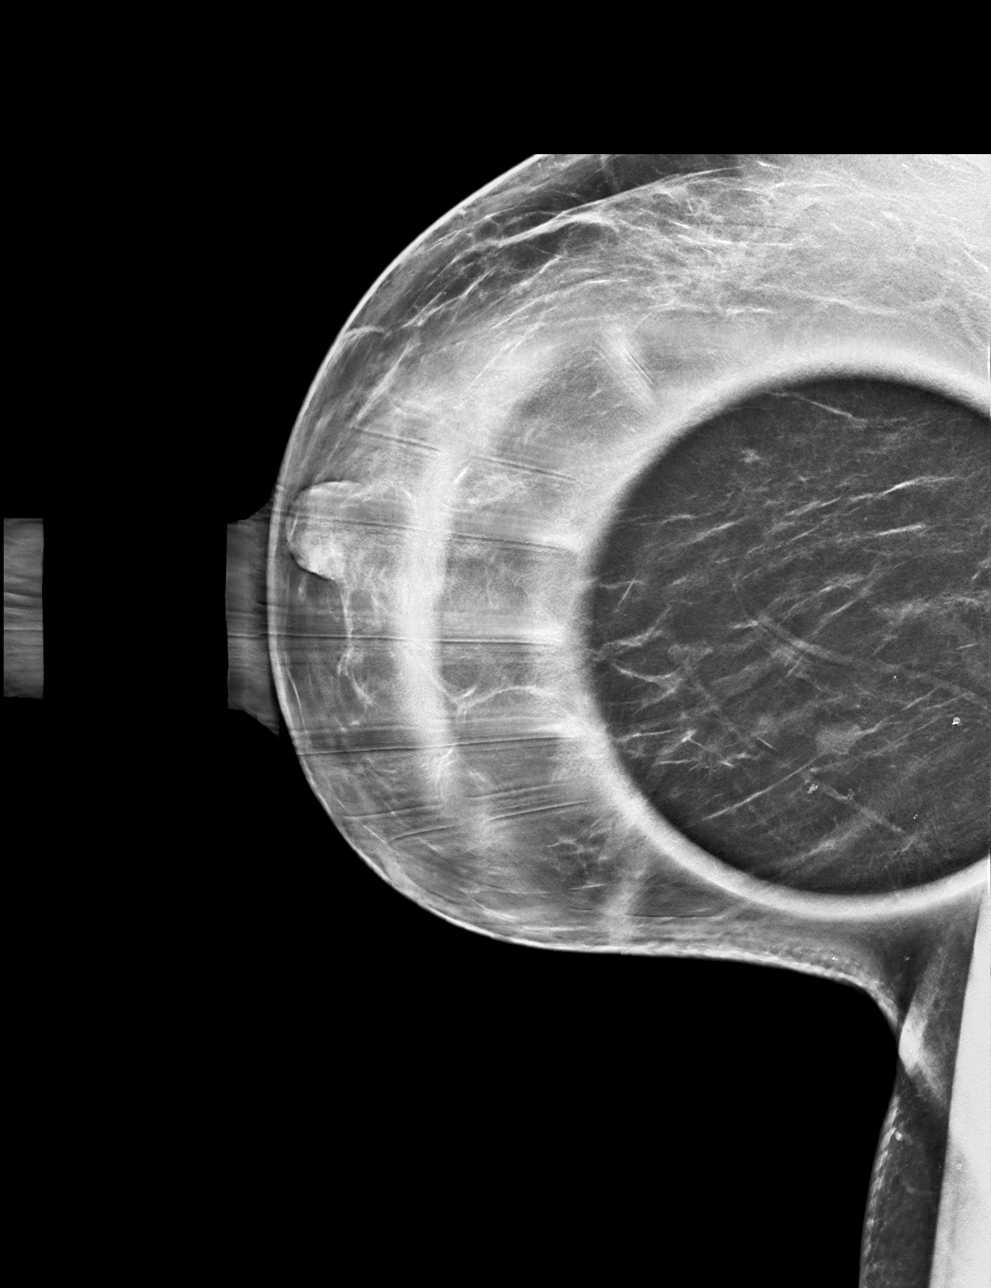

[R CC tomo · tomo slice 31/62.0]
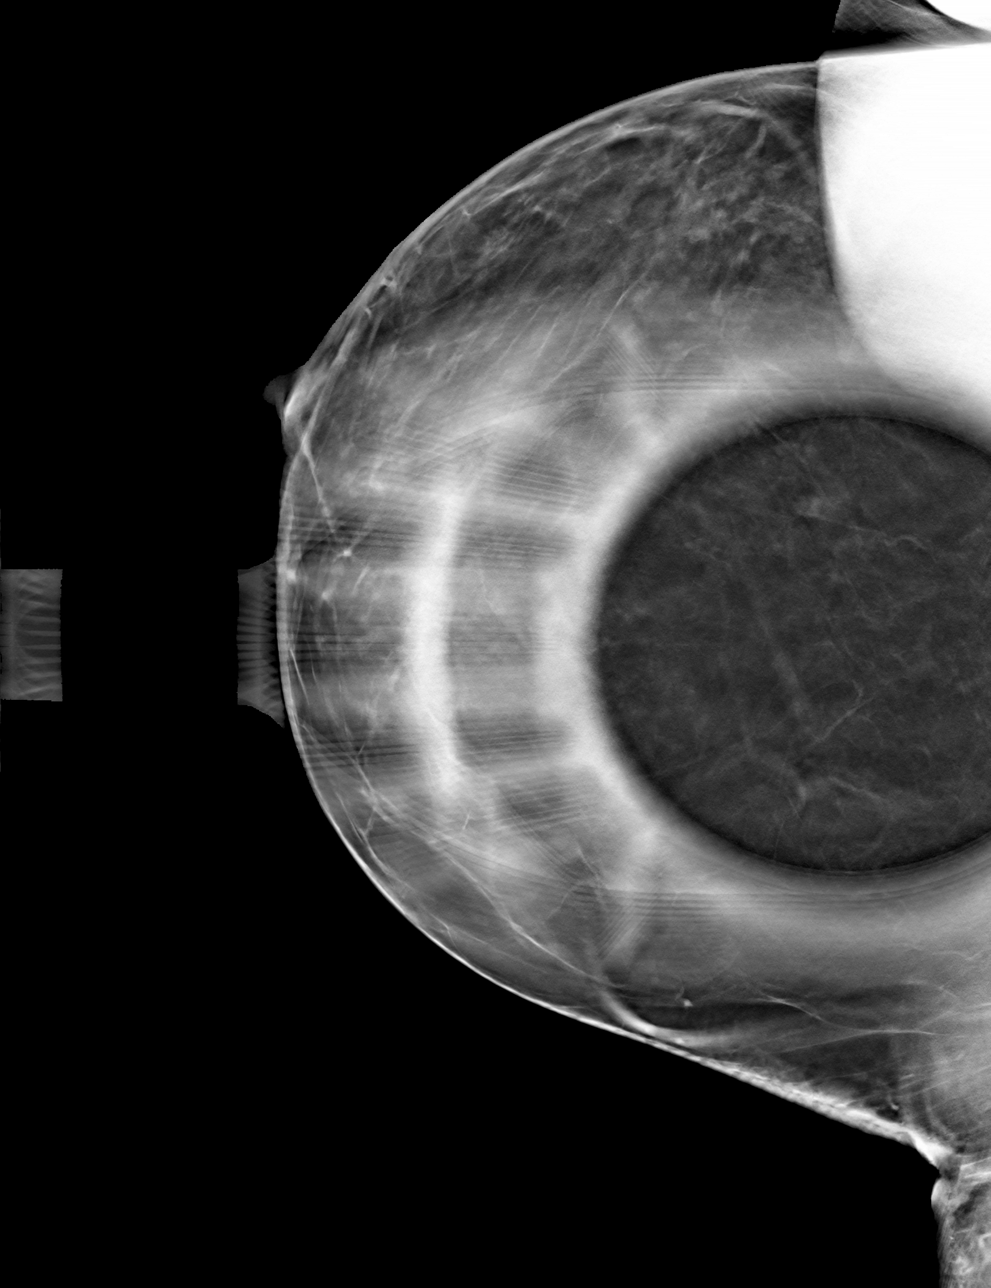

[R MLO tomo · tomo slice 36/71.0]
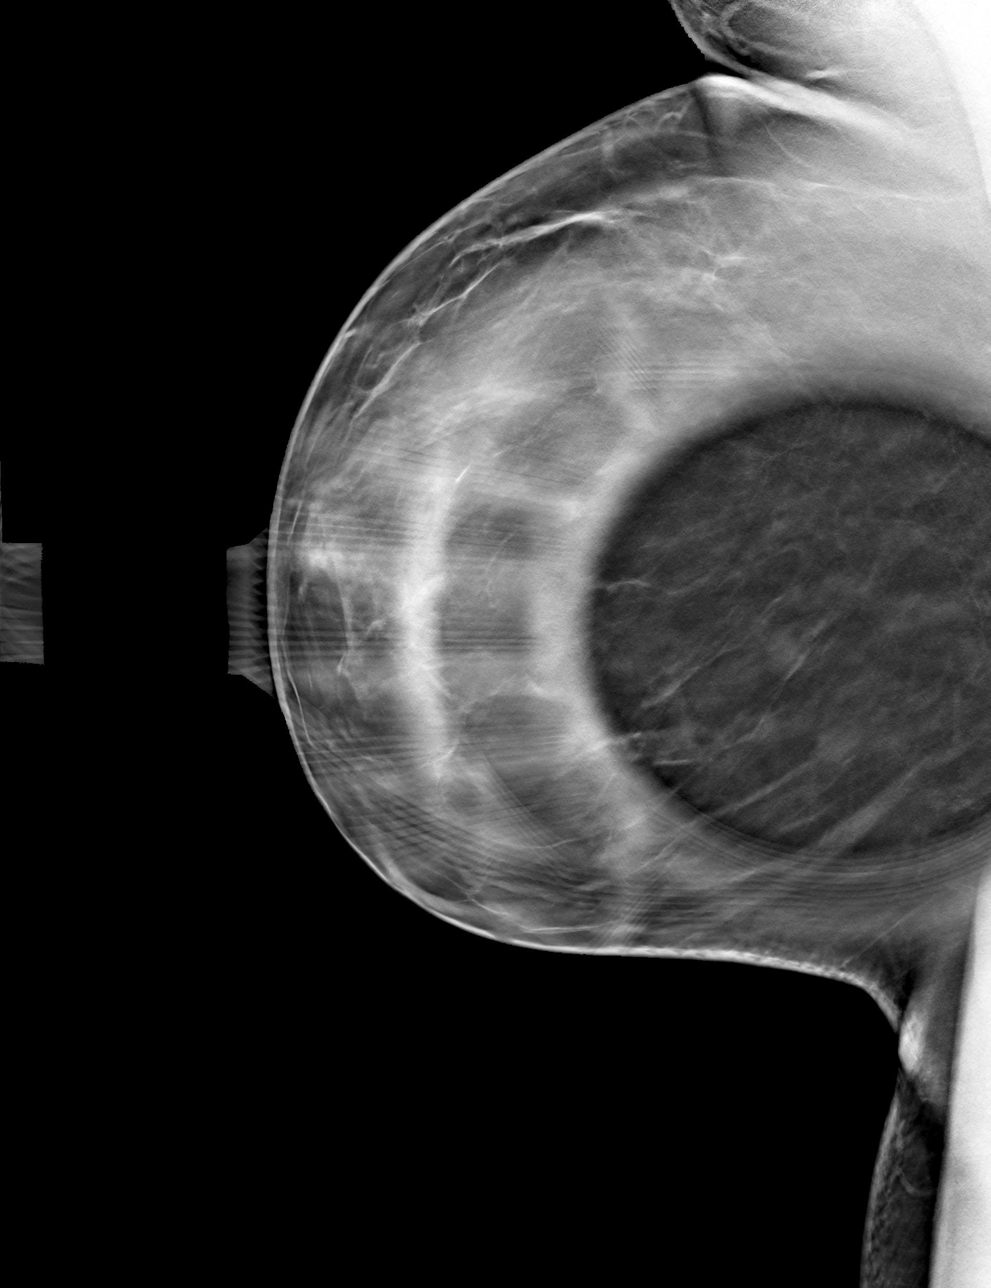

[4 of 12 positions shown; findings below may reference images not displayed]

ACR Breast Density Category b: There are scattered areas of
fibroglandular density.
FINDINGS: The mass in the inferior right breast persists on additional
imaging.

On physical exam, no suspicious lumps identified.

Targeted ultrasound is performed, showing a probable cluster of
cysts in the right breast at 6 o'clock, 4 cm from the nipple
measuring 6 x 8 x 4 mm.
IMPRESSION: Probably benign right breast at 6 o'clock in the right breast,
likely a cluster of cysts.

RECOMMENDATION:
Six-month follow-up mammogram and ultrasound of the probably benign
right breast mass.

I have discussed the findings and recommendations with the patient.
Results were also provided in writing at the conclusion of the
visit. If applicable, a reminder letter will be sent to the patient
regarding the next appointment.

BI-RADS CATEGORY  3: Probably benign.

## 2020-01-23 ENCOUNTER — Other Ambulatory Visit: Payer: Self-pay

## 2020-01-23 ENCOUNTER — Encounter: Payer: Self-pay | Admitting: Obstetrics and Gynecology

## 2020-01-23 ENCOUNTER — Ambulatory Visit (INDEPENDENT_AMBULATORY_CARE_PROVIDER_SITE_OTHER): Payer: BC Managed Care – PPO | Admitting: Obstetrics and Gynecology

## 2020-01-23 VITALS — BP 138/76 | HR 92 | Resp 18 | Ht 60.0 in | Wt 162.8 lb

## 2020-01-23 DIAGNOSIS — Z1239 Encounter for other screening for malignant neoplasm of breast: Secondary | ICD-10-CM | POA: Diagnosis not present

## 2020-01-23 DIAGNOSIS — Z0000 Encounter for general adult medical examination without abnormal findings: Secondary | ICD-10-CM

## 2020-01-23 DIAGNOSIS — Z01419 Encounter for gynecological examination (general) (routine) without abnormal findings: Secondary | ICD-10-CM

## 2020-01-23 DIAGNOSIS — Z131 Encounter for screening for diabetes mellitus: Secondary | ICD-10-CM

## 2020-01-23 DIAGNOSIS — Z13220 Encounter for screening for lipoid disorders: Secondary | ICD-10-CM

## 2020-01-23 DIAGNOSIS — Z1231 Encounter for screening mammogram for malignant neoplasm of breast: Secondary | ICD-10-CM

## 2020-01-23 DIAGNOSIS — Z130 Encounter for screening for diseases of the blood and blood-forming organs and certain disorders involving the immune mechanism: Secondary | ICD-10-CM

## 2020-01-23 DIAGNOSIS — R011 Cardiac murmur, unspecified: Secondary | ICD-10-CM

## 2020-01-23 DIAGNOSIS — Z1329 Encounter for screening for other suspected endocrine disorder: Secondary | ICD-10-CM

## 2020-01-23 NOTE — Progress Notes (Signed)
Gynecology Annual Exam  PCP: [facility], [place]  Chief Complaint:  Chief Complaint  Patient presents with  . Gynecologic Exam    annual exam    History of Present Illness: Patient is a 44 y.o. O1Y0737 presents for annual exam. The patient has no complaints today.   LMP: Patient's last menstrual period was 12/09/2019. Average Interval: generally monthly, has skipped 2 periods this year Duration of flow: several days Heavy Menses: no Clots: no Intermenstrual Bleeding: no Postcoital Bleeding: no Dysmenorrhea: no   The patient is sexually active. She currently uses tubal ligation for contraception. She denies dyspareunia.  The patient does perform self breast exams.  There is notable family history of breast or ovarian cancer in her family.  The patient wears seatbelts: no.   The patient has regular exercise: yes.    The patient denies current symptoms of depression.    Review of Systems: ROS  Past Medical History:  Past Medical History:  Diagnosis Date  . Acne   . Astigmatism   . Gestational diabetes   . Nearsightedness    glasses  . Obesity (BMI 35.0-39.9 without comorbidity)   . PCOS (polycystic ovarian syndrome)     Past Surgical History:  Past Surgical History:  Procedure Laterality Date  . CESAREAN SECTION     2003,2011  . DILATION AND CURETTAGE OF UTERUS  03/2002  . TUBAL LIGATION  08/2009    Gynecologic History:  Patient's last menstrual period was 12/09/2019. Contraception: tubal ligation Last Pap: Results were: 2019 NIL  Last mammogram: 2020 Results were: BI-RAD III Obstetric History: [id number]  Family History:  Family History  Problem Relation Age of Onset  . Breast cancer Maternal Grandmother        60's  . Breast cancer Paternal Grandmother        early 1's; unsure of primary-found in lung  . Lung cancer Mother 36  . Cancer Mother        skin  . Hypertension Maternal Uncle   . Melanoma Maternal Grandfather        deceased in  his 23s  . Leukemia Paternal Grandfather 47    Social History:  Social History   Socioeconomic History  . Marital status: Married    Spouse name: Aurther Loft  . Number of children: 2  . Years of education: 16  . Highest education level: Bachelor's degree (e.g., BA, AB, BS)  Occupational History  . Occupation: Teacher    Comment: [employer]  Tobacco Use  . Smoking status: Never Smoker  . Smokeless tobacco: Never Used  Vaping Use  . Vaping Use: Never used  Substance and Sexual Activity  . Alcohol use: Yes    Comment: occasional  . Drug use: No  . Sexual activity: Yes    Partners: Male    Birth control/protection: Surgical  Other Topics Concern  . Not on file  Social History Narrative  . Not on file   Social Determinants of Health   Financial Resource Strain:   . Difficulty of Paying Living Expenses:   Food Insecurity:   . Worried About Programme researcher, broadcasting/film/video in the Last Year:   . Barista in the Last Year:   Transportation Needs:   . Freight forwarder (Medical):   Marland Kitchen Lack of Transportation (Non-Medical):   Physical Activity:   . Days of Exercise per Week:   . Minutes of Exercise per Session:   Stress:   . Feeling of Stress :  Social Connections:   . Frequency of Communication with Friends and Family:   . Frequency of Social Gatherings with Friends and Family:   . Attends Religious Services:   . Active Member of Clubs or Organizations:   . Attends Banker Meetings:   Marland Kitchen Marital Status:   Intimate Partner Violence:   . Fear of Current or Ex-Partner:   . Emotionally Abused:   Marland Kitchen Physically Abused:   . Sexually Abused:     Allergies:  No Known Allergies  Medications: Prior to Admission medications   Not on File    Physical Exam Vitals: Blood pressure 138/76, pulse 92, resp. rate 18, height 5' (1.524 m), weight 162 lb 12.8 oz (73.8 kg), last menstrual period 12/09/2019, SpO2 98 %.  OBGyn Exam   Female chaperone present for pelvic and  breast  portions of the physical exam  Assessment: 44 y.o. [id number] routine annual exam  Plan: Problem List Items Addressed This Visit    None    Visit Diagnoses    Encounter for annual routine gynecological examination    -  Primary   Health maintenance examination       Breast cancer screening by mammogram       Encounter for gynecological examination without abnormal finding       Encounter for screening breast examination       Screening cholesterol level       Screening for diabetes mellitus       Screening for thyroid disorder       Screening for deficiency anemia          1) Mammogram - recommend yearly screening mammogram.  Mammogram Was ordered today  2) STI screening was offered and declined  3) ASCCP guidelines and rational discussed.  Patient opts for every 3 years screening interval  4) Routine healthcare maintenance including cholesterol, diabetes screening discussed To return fasting at a later date. Patient wil have done through [facility]  5) Systolic murmur, referral to Cardiology made. Patient reports she may of had the murmur since childhood, but has never had a cardiac evaluation. EKG ordered. Instructed patient how to have this performed at the hospital.   Adelene Idler MD, [facility], [facility] 01/23/2020 12:34 PM

## 2020-01-23 NOTE — Patient Instructions (Addendum)
Exercising to Stay Healthy To become healthy and stay healthy, it is recommended that you do moderate-intensity and vigorous-intensity exercise. You can tell that you are exercising at a moderate intensity if your heart starts beating faster and you start breathing faster but can still hold a conversation. You can tell that you are exercising at a vigorous intensity if you are breathing much harder and faster and cannot hold a conversation while exercising. Exercising regularly is important. It has many health benefits, such as:  Improving overall fitness, flexibility, and endurance.  Increasing bone density.  Helping with weight control.  Decreasing body fat.  Increasing muscle strength.  Reducing stress and tension.  Improving overall health. How often should I exercise? Choose an activity that you enjoy, and set realistic goals. Your health care provider can help you make an activity plan that works for you. Exercise regularly as told by your health care provider. This may include:  Doing strength training two times a week, such as: ? Lifting weights. ? Using resistance bands. ? Push-ups. ? Sit-ups. ? Yoga.  Doing a certain intensity of exercise for a given amount of time. Choose from these options: ? A total of 150 minutes of moderate-intensity exercise every week. ? A total of 75 minutes of vigorous-intensity exercise every week. ? A mix of moderate-intensity and vigorous-intensity exercise every week. Children, pregnant women, people who have not exercised regularly, people who are overweight, and older adults may need to talk with a health care provider about what activities are safe to do. If you have a medical condition, be sure to talk with your health care provider before you start a new exercise program. What are some exercise ideas? Moderate-intensity exercise ideas include:  Walking 1 mile (1.6 km) in about 15  minutes.  Biking.  Hiking.  Golfing.  Dancing.  Water aerobics. Vigorous-intensity exercise ideas include:  Walking 4.5 miles (7.2 km) or more in about 1 hour.  Jogging or running 5 miles (8 km) in about 1 hour.  Biking 10 miles (16.1 km) or more in about 1 hour.  Lap swimming.  Roller-skating or in-line skating.  Cross-country skiing.  Vigorous competitive sports, such as football, basketball, and soccer.  Jumping rope.  Aerobic dancing. What are some everyday activities that can help me to get exercise?  Yard work, such as: ? Pushing a lawn mower. ? Raking and bagging leaves.  Washing your car.  Pushing a stroller.  Shoveling snow.  Gardening.  Washing windows or floors. How can I be more active in my day-to-day activities?  Use stairs instead of an elevator.  Take a walk during your lunch break.  If you drive, park your car farther away from your work or school.  If you take public transportation, get off one stop early and walk the rest of the way.  Stand up or walk around during all of your indoor phone calls.  Get up, stretch, and walk around every 30 minutes throughout the day.  Enjoy exercise with a friend. Support to continue exercising will help you keep a regular routine of activity. What guidelines can I follow while exercising?  Before you start a new exercise program, talk with your health care provider.  Do not exercise so much that you hurt yourself, feel dizzy, or get very short of breath.  Wear comfortable clothes and wear shoes with good support.  Drink plenty of water while you exercise to prevent dehydration or heat stroke.  Work out until your breathing   and your heartbeat get faster. Where to find more information  [place] Department of Health and Human Services: [employer].at  Centers for Disease Control and Prevention (CDC): http://www.wolf.info/ Summary  Exercising regularly is important. It will improve your overall fitness,  flexibility, and endurance.  Regular exercise also will improve your overall health. It can help you control your weight, reduce stress, and improve your bone density.  Do not exercise so much that you hurt yourself, feel dizzy, or get very short of breath.  Before you start a new exercise program, talk with your health care provider. This information is not intended to replace advice given to you by your health care provider. Make sure you discuss any questions you have with your health care provider. Document Revised: 05/12/2017 Document Reviewed: 04/20/2017 Elsevier Patient Education  [place].   Budget-Friendly Healthy Eating There are many ways to save money at the grocery store and continue to eat healthy. You can be successful if you:  Plan meals according to your budget.  Make a grocery list and only purchase food according to your grocery list.  Prepare food yourself. What are tips for following this plan?  Reading food labels  Compare food labels between brand name foods and the store brand. Often the nutritional value is the same, but the store brand is lower cost.  Look for products that do not have added sugar, fat, or salt (sodium). These often cost the same but are healthier for you. Products may be labeled as: ? Sugar-free. ? Nonfat. ? Low-fat. ? Sodium-free. ? Low-sodium.  Look for lean ground beef labeled as at least 92% lean and 8% fat. Shopping  Buy only the items on your grocery list and go only to the areas of the store that have the items on your list.  Use coupons only for foods and brands you normally buy. Avoid buying items you wouldn't normally buy simply because they are on sale.  Check online and in newspapers for weekly deals.  Buy healthy items from the bulk bins when available, such as herbs, spices, flour, pasta, nuts, and dried fruit.  Buy fruits and vegetables that are in season. Prices are usually lower on in-season  produce.  Look at the unit price on the price tag. Use it to compare different brands and sizes to find out which item is the best deal.  Choose healthy items that are often low-cost, such as carrots, potatoes, apples, bananas, and oranges. Dried or canned beans are a low-cost protein source.  Buy in bulk and freeze extra food. Items you can buy in bulk include meats, fish, poultry, frozen fruits, and frozen vegetables.  Avoid buying "ready-to-eat" foods, such as pre-cut fruits and vegetables and pre-made salads.  If possible, shop around to discover where you can find the best prices. Consider other retailers such as dollar stores, larger [employer], local fruit and vegetable stands, and farmers markets.  Do not shop when you are hungry. If you shop while hungry, it may be hard to stick to your list and budget.  Resist impulse buying. Use your grocery list as your official plan for the week.  Buy a variety of vegetables and fruits by purchasing fresh, frozen, and canned items.  Look at the top and bottom shelves for deals. Foods at eye level (eye level of an adult or child) are usually more expensive.  Be efficient with your time when shopping. The more time you spend at the store, the more money you  are likely to spend.  To save money when choosing more expensive foods like meats and dairy: ? Choose cheaper cuts of meat, such as bone-in chicken thighs and drumsticks instead of skinless and boneless chicken. When you are ready to prepare the chicken, you can remove the skin yourself to make it healthier. ? Choose lean meats like chicken or turkey instead of beef. ? Choose canned seafood, such as tuna, salmon, or sardines. ? Buy eggs as a low-cost source of protein. ? Buy dried beans and peas, such as lentils, split peas, or kidney beans instead of meats. Dried beans and peas are a good alternative source of protein. ? Buy the larger tubs of yogurt instead of individual-sized  containers.  Choose water instead of sodas and other sweetened beverages.  Avoid buying chips, cookies, and other "junk food." These items are usually expensive and not healthy. Cooking  Make extra food and freeze the extras in meal-sized containers or in individual portions for fast meals and snacks.  Pre-cook on days when you have extra time to prepare meals in advance. You can keep these meals in the fridge or freezer and reheat for a quick meal.  When you come home from the grocery store, wash, peel, and cut fruits and vegetables so they are ready to use and eat. This will help reduce food waste. Meal planning  Do not eat out or get fast food. Prepare food at home.  Make a grocery list and make sure to bring it with you to the store. If you have a smart phone, you could use your phone to create your shopping list.  Plan meals and snacks according to a grocery list and budget you create.  Use leftovers in your meal plan for the week.  Look for recipes where you can cook once and make enough food for two meals.  Include budget-friendly meals like stews, casseroles, and stir-fry dishes.  Try some meatless meals or try "no cook" meals like salads.  Make sure that half your plate is filled with fruits or vegetables. Choose from fresh, frozen, or canned fruits and vegetables. If eating canned, remember to rinse them before eating. This will remove any excess salt added for packaging. Summary  Eating healthy on a budget is possible if you plan your meals according to your budget, purchase according to your budget and grocery list, and prepare food yourself.  Tips for buying more food on a limited budget include buying generic brands, using coupons only for foods you normally buy, and buying healthy items from the bulk bins when available.  Tips for buying cheaper food to replace expensive food include choosing cheaper, lean cuts of meat, and buying dried beans and peas. This  information is not intended to replace advice given to you by your health care provider. Make sure you discuss any questions you have with your health care provider. Document Revised: 05/31/2017 Document Reviewed: 05/31/2017 Elsevier Patient Education  2020 Elsevier Inc.   Bone Health Bones protect organs, store calcium, anchor muscles, and support the whole body. Keeping your bones strong is important, especially as you get older. You can take actions to help keep your bones strong and healthy. Why is keeping my bones healthy important?  Keeping your bones healthy is important because your body constantly replaces bone cells. Cells get old, and new cells take their place. As we age, we lose bone cells because the body may not be able to make enough new cells to replace the   old cells. The amount of bone cells and bone tissue you have is referred to as bone mass. The higher your bone mass, the stronger your bones. The aging process leads to an overall loss of bone mass in the body, which can increase the likelihood of:  Joint pain and stiffness.  Broken bones.  A condition in which the bones become weak and brittle (osteoporosis). A large decline in bone mass occurs in older adults. In women, it occurs about the time of menopause. What actions can I take to keep my bones healthy? Good health habits are important for maintaining healthy bones. This includes eating nutritious foods and exercising regularly. To have healthy bones, you need to get enough of the right minerals and vitamins. Most nutrition experts recommend getting these nutrients from the foods that you eat. In some cases, taking supplements may also be recommended. Doing certain types of exercise is also important for bone health. What are the nutritional recommendations for healthy bones?  Eating a well-balanced diet with plenty of calcium and vitamin D will help to protect your bones. Nutritional recommendations vary from person  to person. Ask your health care provider what is healthy for you. Here are some general guidelines. Get enough calcium Calcium is the most important (essential) mineral for bone health. Most people can get enough calcium from their diet, but supplements may be recommended for people who are at risk for osteoporosis. Good sources of calcium include:  Dairy products, such as low-fat or nonfat milk, cheese, and yogurt.  Dark green leafy vegetables, such as bok choy and broccoli.  Calcium-fortified foods, such as orange juice, cereal, bread, soy beverages, and tofu products.  Nuts, such as almonds. Follow these recommended amounts for daily calcium intake:  Children, age 1-3: 700 mg.  Children, age 4-8: 1,000 mg.  Children, age 9-13: 1,300 mg.  Teens, age 14-18: 1,300 mg.  Adults, age 19-50: 1,000 mg.  Adults, age 51-70: ? Men: 1,000 mg. ? Women: 1,200 mg.  Adults, age 71 or older: 1,200 mg.  Pregnant and breastfeeding females: ? Teens: 1,300 mg. ? Adults: 1,000 mg. Get enough vitamin D Vitamin D is the most essential vitamin for bone health. It helps the body absorb calcium. Sunlight stimulates the skin to make vitamin D, so be sure to get enough sunlight. If you live in a cold climate or you do not get outside often, your health care provider may recommend that you take vitamin D supplements. Good sources of vitamin D in your diet include:  Egg yolks.  Saltwater fish.  Milk and cereal fortified with vitamin D. Follow these recommended amounts for daily vitamin D intake:  Children and teens, age 1-18: 600 international units.  Adults, age 50 or younger: 400-800 international units.  Adults, age 51 or older: 800-1,000 international units. Get other important nutrients Other nutrients that are important for bone health include:  Phosphorus. This mineral is found in meat, poultry, dairy foods, nuts, and legumes. The recommended daily intake for adult men and adult women is  700 mg.  Magnesium. This mineral is found in seeds, nuts, dark green vegetables, and legumes. The recommended daily intake for adult men is 400-420 mg. For adult women, it is 310-320 mg.  Vitamin K. This vitamin is found in green leafy vegetables. The recommended daily intake is 120 mg for adult men and 90 mg for adult women. What type of physical activity is best for building and maintaining healthy bones? Weight-bearing and strength-building activities are   important for building and maintaining healthy bones. Weight-bearing activities cause muscles and bones to work against gravity. Strength-building activities increase the strength of the muscles that support bones. Weight-bearing and muscle-building activities include:  Walking and hiking.  Jogging and running.  Dancing.  Gym exercises.  Lifting weights.  Tennis and racquetball.  Climbing stairs.  Aerobics. Adults should get at least 30 minutes of moderate physical activity on most days. Children should get at least 60 minutes of moderate physical activity on most days. Ask your health care provider what type of exercise is best for you. How can I find out if my bone mass is low? Bone mass can be measured with an X-ray test called a bone mineral density (BMD) test. This test is recommended for all women who are age 22 or older. It may also be recommended for:  Men who are age 63 or older.  People who are at risk for osteoporosis because of: ? Having bones that break easily. ? Having a long-term disease that weakens bones, such as kidney disease or rheumatoid arthritis. ? Having menopause earlier than normal. ? Taking medicine that weakens bones, such as steroids, thyroid hormones, or hormone treatment for breast cancer or prostate cancer. ? Smoking. ? Drinking three or more alcoholic drinks a day. If you find that you have a low bone mass, you may be able to prevent osteoporosis or further bone loss by changing your diet and  lifestyle. Where can I find more information? For more information, check out the following websites:  National Osteoporosis Foundation: https://carlson-fletcher.info/  [employer]: www.bones.http://www.myers.net/  International Osteoporosis Foundation: Investment [employer], operational.iofbonehealth.org Summary  The aging process leads to an overall loss of bone mass in the body, which can increase the likelihood of broken bones and osteoporosis.  Eating a well-balanced diet with plenty of calcium and vitamin D will help to protect your bones.  Weight-bearing and strength-building activities are also important for building and maintaining strong bones.  Bone mass can be measured with an X-ray test called a bone mineral density (BMD) test. This information is not intended to replace advice given to you by your health care provider. Make sure you discuss any questions you have with your health care provider. Document Revised: 06/26/2017 Document Reviewed: 06/26/2017 Elsevier Patient Education  2020 Elsevier Inc.   Heart Murmur A heart murmur is an extra sound that is caused by chaotic blood flow through the valves of the heart. The murmur can be heard as a "hum" or "whoosh" sound when blood flows through the heart. There are two types of heart murmurs:  Innocent (benign) murmurs. Most people with this type of heart murmur do not have a heart problem. Many children have innocent heart murmurs. Your health care provider may suggest some basic tests to find out whether your murmur is an innocent murmur. If an innocent heart murmur is found, there is no need for further tests or treatment and no need to restrict activities or stop playing sports.  Abnormal murmurs. These types of murmurs can occur in children and adults. Abnormal murmurs may be a sign of a more serious heart condition, such as a heart defect present at birth (congenital defect) or heart valve disease. What are the causes?  The heart has four areas called  chambers. Valves separate the upper and lower chambers from each other (tricuspid valve and mitral valve) and separate the lower chambers of the heart from pathways that lead away from the heart (aortic valve and pulmonary  valve). Normally, the valves open to let blood flow through or out of your heart, and then they shut to keep the blood from flowing backward. This condition is caused by heart valves that are not working properly.  In children, abnormal heart murmurs are typically caused by congenital defects.  In adults, abnormal murmurs are usually caused by heart valve problems from disease, infection, or aging. This condition may also be caused by:  Pregnancy.  Fever.  Overactive thyroid gland.  Anemia.  Exercise.  Rapid growth spurts (in children). What are the signs or symptoms? Innocent murmurs do not cause symptoms, and many people with abnormal murmurs may not have symptoms. If symptoms do develop, they may include:  Shortness of breath.  Blue coloring of the skin, especially on the fingertips.  Chest pain.  Palpitations, or feeling a fluttering or skipped heartbeat.  Fainting.  Persistent cough.  Getting tired much faster than expected.  Swelling in the abdomen, feet, or ankles. How is this diagnosed? This condition may be diagnosed during a routine physical or other exam. If your health care provider hears a murmur with a stethoscope, he or she will listen for:  Where the murmur is located in your heart.  How long the murmur lasts (duration).  When the murmur is heard during the heartbeat.  How loud the murmur is. This may help the health care provider figure out what is causing the murmur. You may be referred to a heart specialist (cardiologist). You may also have other tests, including:  Electrocardiogram (ECG or EKG). This test measures the electrical activity of your heart.  Echocardiogram. This test uses high frequency sound waves to make pictures  of your heart.  MRI or chest X-ray.  Cardiac catheterization. This test looks at blood flow through the arteries around the heart. For children and adults who have an abnormal heart murmur and want to stay active, it is important to:  Complete testing.  Review test results.  Receive recommendations from your health care provider. If heart disease is present, it may not be safe to play or be active. How is this treated? Heart murmurs themselves do not need treatment. In some cases, a heart murmur may go away on its own. If an underlying problem or disease is causing the murmur, you may need treatment. If treatment is needed, it will depend on the type and severity of the disease or heart problem causing the murmur. Treatment may include:  Medicine.  Surgery.  Dietary and lifestyle changes. Follow these instructions at home:  Talk with your health care provider before participating in sports or other activities that require a lot of effort and energy (are strenuous).  Learn as much as possible about your condition and any related diseases. Ask your health care provider if you may be at risk for any medical emergencies.  Talk with your health care provider about what symptoms you should look out for.  It is up to you to get your test results. Ask your health care provider, or the department that is doing the test, when your results will be ready.  Keep all follow-up visits as told by your health care provider. This is important. Contact a health care provider if:  You are frequently short of breath.  You feel more tired than usual.  You are having a hard time keeping up with normal activities or fitness routines.  You have swelling in your ankles or feet.  You notice that your heart often beats irregularly.  You develop any new symptoms. Get help right away if:  You have chest pain.  You are having trouble breathing.  You feel light-headed or you pass out.  Your  symptoms suddenly get worse. These symptoms may represent a serious problem that is an emergency. Do not wait to see if the symptoms will go away. Get medical help right away. Call your local emergency services (911 in the [place] Do not drive yourself to the hospital. Summary  Normally, the heart valves open to let blood flow through or out of your heart, and then they shut to keep the blood from flowing backward.  A heart murmur is caused by heart valves that are not working properly.  You may need treatment if an underlying problem or disease is causing the heart murmur. Treatment may include medicine, surgery, or dietary and lifestyle changes.  Talk with your health care provider before participating in sports or other activities that require a lot of effort and energy (are strenuous).  Talk with your health care provider about what symptoms you should watch out for. This information is not intended to replace advice given to you by your health care provider. Make sure you discuss any questions you have with your health care provider. Document Revised: 11/21/2017 Document Reviewed: 11/21/2017 Elsevier Patient Education  2020 [employer].

## 2020-04-09 ENCOUNTER — Other Ambulatory Visit: Payer: Self-pay | Admitting: Obstetrics and Gynecology

## 2020-04-09 ENCOUNTER — Telehealth: Payer: Self-pay

## 2020-04-09 DIAGNOSIS — N6310 Unspecified lump in the right breast, unspecified quadrant: Secondary | ICD-10-CM

## 2020-04-09 DIAGNOSIS — R928 Other abnormal and inconclusive findings on diagnostic imaging of breast: Secondary | ICD-10-CM

## 2020-04-09 NOTE — Telephone Encounter (Signed)
Pt needs correct order for mammo sent to [facility]. Please advise.

## 2020-04-10 NOTE — Telephone Encounter (Signed)
I cosigned order from [facility], should be completed now

## 2020-04-14 NOTE — Telephone Encounter (Signed)
Pt calling; she called [facility] and was told they didn't have the correct orders for what she needs to have done; pt calling to see if correct order have been sent in.  [phone]  Left detailed msg orders were sent 04/09/20.  If still not correct, please let us know.

## 2020-05-05 ENCOUNTER — Other Ambulatory Visit: Payer: Self-pay

## 2020-05-05 ENCOUNTER — Ambulatory Visit
Admission: RE | Admit: 2020-05-05 | Discharge: 2020-05-05 | Disposition: A | Discharge status: Home / Self Care | Payer: BC Managed Care – PPO | Source: Ambulatory Visit | Attending: Obstetrics and Gynecology | Admitting: Obstetrics and Gynecology

## 2020-05-05 DIAGNOSIS — N6310 Unspecified lump in the right breast, unspecified quadrant: Secondary | ICD-10-CM

## 2020-05-05 DIAGNOSIS — R928 Other abnormal and inconclusive findings on diagnostic imaging of breast: Secondary | ICD-10-CM | POA: Diagnosis present

## 2020-05-06 ENCOUNTER — Other Ambulatory Visit: Payer: Self-pay | Admitting: Obstetrics and Gynecology

## 2020-05-06 DIAGNOSIS — N6310 Unspecified lump in the right breast, unspecified quadrant: Secondary | ICD-10-CM

## 2020-05-06 DIAGNOSIS — R928 Other abnormal and inconclusive findings on diagnostic imaging of breast: Secondary | ICD-10-CM

## 2020-05-15 ENCOUNTER — Telehealth: Payer: Self-pay

## 2020-05-15 ENCOUNTER — Telehealth: Payer: Self-pay | Admitting: *Deleted

## 2020-05-15 NOTE — Telephone Encounter (Signed)
Patient states [facility] has requested an order for her to have a Needle biopsy. They have not received a response. She would like to get this scheduled as quickly as possible. TT#[phone]

## 2020-05-15 NOTE — Telephone Encounter (Signed)
Per Dr. Jerene Pitch, orders were signed yesterday. Sam w/[facility] will be contacting the patient for scheduling today. LMVM to notify patient.

## 2020-05-15 NOTE — Telephone Encounter (Signed)
VM TO PT TO [facility] 

## 2020-05-21 ENCOUNTER — Other Ambulatory Visit: Payer: Self-pay

## 2020-05-21 ENCOUNTER — Ambulatory Visit
Admission: RE | Admit: 2020-05-21 | Discharge: 2020-05-21 | Disposition: A | Discharge status: Home / Self Care | Payer: BC Managed Care – PPO | Source: Ambulatory Visit | Attending: Obstetrics and Gynecology | Admitting: Obstetrics and Gynecology

## 2020-05-21 DIAGNOSIS — N6310 Unspecified lump in the right breast, unspecified quadrant: Secondary | ICD-10-CM | POA: Diagnosis present

## 2020-05-21 DIAGNOSIS — R928 Other abnormal and inconclusive findings on diagnostic imaging of breast: Secondary | ICD-10-CM | POA: Insufficient documentation

## 2020-05-21 HISTORY — PX: BREAST BIOPSY: SHX20

## 2020-05-22 LAB — SURGICAL PATHOLOGY

## 2021-05-26 ENCOUNTER — Other Ambulatory Visit: Payer: Self-pay

## 2021-05-26 ENCOUNTER — Other Ambulatory Visit (HOSPITAL_COMMUNITY)
Admission: RE | Admit: 2021-05-26 | Discharge: 2021-05-26 | Disposition: A | Discharge status: Home / Self Care | Payer: BC Managed Care – PPO | Source: Ambulatory Visit | Attending: Advanced Practice Midwife | Admitting: Advanced Practice Midwife

## 2021-05-26 ENCOUNTER — Encounter: Payer: Self-pay | Admitting: Advanced Practice Midwife

## 2021-05-26 ENCOUNTER — Ambulatory Visit (INDEPENDENT_AMBULATORY_CARE_PROVIDER_SITE_OTHER): Payer: BC Managed Care – PPO | Admitting: Advanced Practice Midwife

## 2021-05-26 VITALS — Ht 60.0 in | Wt 166.8 lb

## 2021-05-26 DIAGNOSIS — Z01419 Encounter for gynecological examination (general) (routine) without abnormal findings: Secondary | ICD-10-CM | POA: Insufficient documentation

## 2021-05-26 DIAGNOSIS — Z124 Encounter for screening for malignant neoplasm of cervix: Secondary | ICD-10-CM | POA: Insufficient documentation

## 2021-05-26 DIAGNOSIS — Z1239 Encounter for other screening for malignant neoplasm of breast: Secondary | ICD-10-CM

## 2021-05-26 NOTE — Patient Instructions (Signed)
Menopause Menopause is the normal time of a woman's life when menstrual periods stop completely. It marks the natural end to a woman's ability to become pregnant. It can be defined as the absence of a menstrual period for 12 months without another medical cause. The transition to menopause (perimenopause) most often happens between the ages of 45 and 55, and can last for many years. During perimenopause, hormone levels change in your body, which can cause symptoms and affect your health. Menopause may increase your risk for: Weakened bones (osteoporosis), which causes fractures. Depression. Hardening and narrowing of the arteries (atherosclerosis), which can cause heart attacks and strokes. What are the causes? This condition is usually caused by a natural change in hormone levels that happens as you get older. The condition may also be caused by changes that are not natural, including: Surgery to remove both ovaries (surgical menopause). Side effects from some medicines, such as chemotherapy used to treat cancer (chemical menopause). What increases the risk? This condition is more likely to start at an earlier age if you have certain medical conditions or have undergone treatments, including: A tumor of the pituitary gland in the brain. A disease that affects the ovaries and hormones. Certain cancer treatments, such as chemotherapy or hormone therapy, or radiation therapy on the pelvis. Heavy smoking and excessive alcohol use. Family history of early menopause. This condition is also more likely to develop earlier in women who are very thin. What are the signs or symptoms? Symptoms of this condition include: Hot flashes. Irregular menstrual periods. Night sweats. Changes in feelings about sex. This could be a decrease in sex drive or an increased discomfort around your sexuality. Vaginal dryness and thinning of the vaginal walls. This may cause painful sex. Dryness of the skin and  development of wrinkles. Headaches. Problems sleeping (insomnia). Mood swings or irritability. Memory problems. Weight gain. Hair growth on the face and chest. Bladder infections or problems with urinating. How is this diagnosed? This condition is diagnosed based on your medical history, a physical exam, your age, your menstrual history, and your symptoms. Hormone tests may also be done. How is this treated? In some cases, no treatment is needed. You and your health care provider should make a decision together about whether treatment is necessary. Treatment will be based on your individual condition and preferences. Treatment for this condition focuses on managing symptoms. Treatment may include: Menopausal hormone therapy (MHT). Medicines to treat specific symptoms or complications. Acupuncture. Vitamin or herbal supplements. Before starting treatment, make sure to let your health care provider know if you have a personal or family history of these conditions: Heart disease. Breast cancer. Blood clots. Diabetes. Osteoporosis. Follow these instructions at home: Lifestyle Do not use any products that contain nicotine or tobacco, such as cigarettes, e-cigarettes, and chewing tobacco. If you need help quitting, ask your health care provider. Get at least 30 minutes of physical activity on 5 or more days each week. Avoid alcoholic and caffeinated beverages, as well as spicy foods. This may help prevent hot flashes. Get 7-8 hours of sleep each night. If you have hot flashes, try: Dressing in layers. Avoiding things that may trigger hot flashes, such as spicy food, warm places, or stress. Taking slow, deep breaths when a hot flash starts. Keeping a fan in your home and office. Find ways to manage stress, such as deep breathing, meditation, or journaling. Consider going to group therapy with other women who are having menopause symptoms. Ask your health care   provider about recommended  group therapy meetings. Eating and drinking  Eat a healthy, balanced diet that contains whole grains, lean protein, low-fat dairy, and plenty of fruits and vegetables. Your health care provider may recommend adding more soy to your diet. Foods that contain soy include tofu, tempeh, and soy milk. Eat plenty of foods that contain calcium and vitamin D for bone health. Items that are rich in calcium include low-fat milk, yogurt, beans, almonds, sardines, broccoli, and kale. Medicines Take over-the-counter and prescription medicines only as told by your health care provider. Talk with your health care provider before starting any herbal supplements. If prescribed, take vitamins and supplements as told by your health care provider. General instructions  Keep track of your menstrual periods, including: When they occur. How heavy they are and how long they last. How much time passes between periods. Keep track of your symptoms, noting when they start, how often you have them, and how long they last. Use vaginal lubricants or moisturizers to help with vaginal dryness and improve comfort during sex. Keep all follow-up visits. This is important. This includes any group therapy or counseling. Contact a health care provider if: You are still having menstrual periods after age 55. You have pain during sex. You have not had a period for 12 months and you develop vaginal bleeding. Get help right away if you have: Severe depression. Excessive vaginal bleeding. Pain when you urinate. A fast or irregular heartbeat (palpitations). Severe headaches. Abdominal pain or severe indigestion. Summary Menopause is a normal time of life when menstrual periods stop completely. It is usually defined as the absence of a menstrual period for 12 months without another medical cause. The transition to menopause (perimenopause) most often happens between the ages of 45 and 55 and can last for several years. Symptoms  can be managed through medicines, lifestyle changes, and complementary therapies such as acupuncture. Eat a balanced diet that is rich in nutrients to promote bone health and heart health and to manage symptoms during menopause. This information is not intended to replace advice given to you by your health care provider. Make sure you discuss any questions you have with your health care provider. Document Revised: 02/28/2020 Document Reviewed: 11/14/2019 Elsevier Patient Education  2022 Elsevier Inc. Health Maintenance, Female Adopting a healthy lifestyle and getting preventive care are important in promoting health and wellness. Ask your health care provider about: The right schedule for you to have regular tests and exams. Things you can do on your own to prevent diseases and keep yourself healthy. What should I know about diet, weight, and exercise? Eat a healthy diet  Eat a diet that includes plenty of vegetables, fruits, low-fat dairy products, and lean protein. Do not eat a lot of foods that are high in solid fats, added sugars, or sodium. Maintain a healthy weight Body mass index (BMI) is used to identify weight problems. It estimates body fat based on height and weight. Your health care provider can help determine your BMI and help you achieve or maintain a healthy weight. Get regular exercise Get regular exercise. This is one of the most important things you can do for your health. Most adults should: Exercise for at least 150 minutes each week. The exercise should increase your heart rate and make you sweat (moderate-intensity exercise). Do strengthening exercises at least twice a week. This is in addition to the moderate-intensity exercise. Spend less time sitting. Even light physical activity can be beneficial. Watch cholesterol and blood   lipids Have your blood tested for lipids and cholesterol at 45 years of age, then have this test every 5 years. Have your cholesterol levels  checked more often if: Your lipid or cholesterol levels are high. You are older than 45 years of age. You are at high risk for heart disease. What should I know about cancer screening? Depending on your health history and family history, you may need to have cancer screening at various ages. This may include screening for: Breast cancer. Cervical cancer. Colorectal cancer. Skin cancer. Lung cancer. What should I know about heart disease, diabetes, and high blood pressure? Blood pressure and heart disease High blood pressure causes heart disease and increases the risk of stroke. This is more likely to develop in people who have high blood pressure readings or are overweight. Have your blood pressure checked: Every 3-5 years if you are 18-39 years of age. Every year if you are 40 years old or older. Diabetes Have regular diabetes screenings. This checks your fasting blood sugar level. Have the screening done: Once every three years after age 40 if you are at a normal weight and have a low risk for diabetes. More often and at a younger age if you are overweight or have a high risk for diabetes. What should I know about preventing infection? Hepatitis B If you have a higher risk for hepatitis B, you should be screened for this virus. Talk with your health care provider to find out if you are at risk for hepatitis B infection. Hepatitis C Testing is recommended for: Everyone born from 1945 through 1965. Anyone with known risk factors for hepatitis C. Sexually transmitted infections (STIs) Get screened for STIs, including gonorrhea and chlamydia, if: You are sexually active and are younger than 45 years of age. You are older than 45 years of age and your health care provider tells you that you are at risk for this type of infection. Your sexual activity has changed since you were last screened, and you are at increased risk for chlamydia or gonorrhea. Ask your health care provider if you  are at risk. Ask your health care provider about whether you are at high risk for HIV. Your health care provider may recommend a prescription medicine to help prevent HIV infection. If you choose to take medicine to prevent HIV, you should first get tested for HIV. You should then be tested every 3 months for as long as you are taking the medicine. Pregnancy If you are about to stop having your period (premenopausal) and you may become pregnant, seek counseling before you get pregnant. Take 400 to 800 micrograms (mcg) of folic acid every day if you become pregnant. Ask for birth control (contraception) if you want to prevent pregnancy. Osteoporosis and menopause Osteoporosis is a disease in which the bones lose minerals and strength with aging. This can result in bone fractures. If you are 65 years old or older, or if you are at risk for osteoporosis and fractures, ask your health care provider if you should: Be screened for bone loss. Take a calcium or vitamin D supplement to lower your risk of fractures. Be given hormone replacement therapy (HRT) to treat symptoms of menopause. Follow these instructions at home: Alcohol use Do not drink alcohol if: Your health care provider tells you not to drink. You are pregnant, may be pregnant, or are planning to become pregnant. If you drink alcohol: Limit how much you have to: 0-1 drink a day. Know how much   alcohol is in your drink. In the [place] one drink equals one 12 oz bottle of beer (355 mL), one 5 oz glass of wine (148 mL), or one 1 oz glass of hard liquor (44 mL). Lifestyle Do not use any products that contain nicotine or tobacco. These products include cigarettes, chewing tobacco, and vaping devices, such as e-cigarettes. If you need help quitting, ask your health care provider. Do not use street drugs. Do not share needles. Ask your health care provider for help if you need support or information about quitting drugs. General  instructions Schedule regular health, dental, and eye exams. Stay current with your vaccines. Tell your health care provider if: You often feel depressed. You have ever been abused or do not feel safe at home. Summary Adopting a healthy lifestyle and getting preventive care are important in promoting health and wellness. Follow your health care provider's instructions about healthy diet, exercising, and getting tested or screened for diseases. Follow your health care provider's instructions on monitoring your cholesterol and blood pressure. This information is not intended to replace advice given to you by your health care provider. Make sure you discuss any questions you have with your health care provider. Document Revised: 10/19/2020 Document Reviewed: 10/19/2020 Elsevier Patient Education  2022 Elsevier Inc.  

## 2021-05-27 NOTE — Progress Notes (Signed)
Gynecology Annual Exam  Date of Service: 05/26/2021  PCP: [facility], [place]  Chief Complaint:  Chief Complaint  Patient presents with   Gynecologic Exam    History of Present Illness: Patient is a 45 y.o. [id number] presents for annual exam. The patient has no complaints today. She has noticed some irregularities in her menstrual cycle in the past year and she mentions occasional mild hot flashes.  LMP: Patient's last menstrual period was 05/03/2021. Average Interval: irregular, every 1-2 months Duration of flow: 4 days Heavy Menses: some cycles are heavier  Clots: no Intermenstrual Bleeding: had 2 cycles in a month one time in the past year Postcoital Bleeding: no Dysmenorrhea: no   The patient is sexually active. She currently uses tubal ligation for contraception. She denies dyspareunia.  The patient does perform self breast exams.  There is no notable family history of breast or ovarian cancer in her family.  The patient wears seatbelts: yes.   The patient has regular exercise:  she walks several miles usually daily .  She admits healthy lifestyle; diet, hydration, sleep.  The patient denies current symptoms of depression.    Review of Systems: Review of Systems  Constitutional:  Negative for chills and fever.  HENT:  Negative for congestion, ear discharge, ear pain, hearing loss, sinus pain and sore throat.   Eyes:  Negative for blurred vision and double vision.  Respiratory:  Negative for cough, shortness of breath and wheezing.   Cardiovascular:  Negative for chest pain, palpitations and leg swelling.  Gastrointestinal:  Negative for abdominal pain, blood in stool, constipation, diarrhea, heartburn, melena, nausea and vomiting.  Genitourinary:  Negative for dysuria, flank pain, frequency, hematuria and urgency.  Musculoskeletal:  Negative for back pain, joint pain and myalgias.  Skin:  Negative for itching and rash.  Neurological:  Negative for dizziness,  tingling, tremors, sensory change, speech change, focal weakness, seizures, loss of consciousness, weakness and headaches.  Endo/Heme/Allergies:  Negative for environmental allergies. Does not bruise/bleed easily.  Psychiatric/Behavioral:  Negative for depression, hallucinations, memory loss, substance abuse and suicidal ideas. The patient is not nervous/anxious and does not have insomnia.    Past Medical History:  Patient Active Problem List   Diagnosis Date Noted   PCOS (polycystic ovarian syndrome) 11/08/2016   Obesity (BMI 35.0-39.9 without comorbidity) 11/08/2016   History of gestational diabetes 11/08/2016    Both pregnancies     Past Surgical History:  Past Surgical History:  Procedure Laterality Date   BREAST BIOPSY Right 05/21/2020   [place] bx, Q marker, path pending   CESAREAN SECTION     [phone]   DILATION AND CURETTAGE OF UTERUS  03/2002   TUBAL LIGATION  08/2009    Gynecologic History:  Patient's last menstrual period was 05/03/2021. Contraception: tubal ligation Last Pap: 3 years ago Results were:  no abnormalities  Last mammogram: 1 year ago Results were: BI-RADS IV suspicious with finding of benign cyst right breast  Obstetric History: [id number]  Family History:  Family History  Problem Relation Age of Onset   Breast cancer Maternal Grandmother        60's   Breast cancer Paternal Grandmother        early 52's; unsure of primary-found in lung   Lung cancer Mother 34   Cancer Mother        skin   Hypertension Maternal Uncle    Melanoma Maternal Grandfather        deceased in his 53s  Leukemia Paternal Grandfather 52    Social History:  Social History   Socioeconomic History   Marital status: Married    Spouse name: Coralyn Mark   Number of children: 2   Years of education: 16   Highest education level: Bachelor's degree (e.g., BA, AB, BS)  Occupational History   Occupation: Teacher    Comment: [employer]  Tobacco Use   Smoking status: Never    Smokeless tobacco: Never  Vaping Use   Vaping Use: Never used  Substance and Sexual Activity   Alcohol use: Yes    Comment: occasional   Drug use: No   Sexual activity: Yes    Partners: Male    Birth control/protection: Surgical  Other Topics Concern   Not on file  Social History Narrative   Not on file   Social Determinants of Health   Financial Resource Strain: Not on file  Food Insecurity: Not on file  Transportation Needs: Not on file  Physical Activity: Not on file  Stress: Not on file  Social Connections: Not on file  Intimate Partner Violence: Not on file    Allergies:  No Known Allergies  Medications: Prior to Admission medications   Not on File    Physical Exam Vitals: Height 5' (1.524 m), weight 166 lb 12.8 oz (75.7 kg), last menstrual period 05/03/2021.  General: NAD HEENT: normocephalic, anicteric Thyroid: no enlargement, no palpable nodules Pulmonary: No increased work of breathing, CTAB Cardiovascular: RRR, distal pulses 2+ Breast: Breast symmetrical, no tenderness, no palpable nodules or masses, no skin or nipple retraction present, no nipple discharge.  No axillary or supraclavicular lymphadenopathy. Abdomen: NABS, soft, non-tender, non-distended.  Umbilicus without lesions.  No hepatomegaly, splenomegaly or masses palpable. No evidence of hernia  Genitourinary:  External: Normal external female genitalia.  Normal urethral meatus, normal Bartholin's and Skene's glands.    Vagina: Normal vaginal mucosa, no evidence of prolapse.    Cervix: Grossly normal in appearance, no bleeding, no CMT Extremities: no edema, erythema, or tenderness Neurologic: Grossly intact Psychiatric: mood appropriate, affect full   Assessment: 45 y.o. [id number] routine annual exam  Plan: Problem List Items Addressed This Visit   None Visit Diagnoses     Well woman exam with routine gynecological exam    -  Primary   Relevant Orders   Cytology - PAP   Screening for  cervical cancer       Relevant Orders   Cytology - PAP   Breast screening       Relevant Orders   MM 3D SCREEN BREAST BILATERAL       1) Mammogram - recommend yearly screening mammogram.  Mammogram Was ordered today  2) STI screening  was offered and declined  3) ASCCP guidelines and rationale discussed.  Patient opts for every 3 years screening interval  4) Contraception - the patient is currently using  tubal ligation.  She is happy with her current form of contraception and plans to continue  5) Colonoscopy: declines screening at this time -- Screening recommended starting at age 60 for average risk individuals, age 72 for individuals deemed at increased risk (including African Americans) and recommended to continue until age 74.  For patient age 89-85 individualized approach is recommended.  Gold standard screening is via colonoscopy, Cologuard screening is an acceptable alternative for patient unwilling or unable to undergo colonoscopy.  "Colorectal cancer screening for average?risk adults: 2018 guideline update from the American Cancer Society"CA: A Cancer Journal for Clinicians: Nov 09, 2016  6) Routine healthcare maintenance including cholesterol, diabetes screening discussed Declines  7) Return in about 1 year (around 05/26/2022) for annual established gyn.   Tresea Mall, CNM [facility] 05/27/2021, 10:31 AM

## 2021-05-31 LAB — CYTOLOGY - PAP
Comment: NEGATIVE
Diagnosis: NEGATIVE
High risk HPV: NEGATIVE

## 2021-06-30 ENCOUNTER — Ambulatory Visit
Admission: RE | Admit: 2021-06-30 | Discharge: 2021-06-30 | Disposition: A | Discharge status: Home / Self Care | Payer: BC Managed Care – PPO | Source: Ambulatory Visit | Attending: Advanced Practice Midwife | Admitting: Advanced Practice Midwife

## 2021-06-30 ENCOUNTER — Other Ambulatory Visit: Payer: Self-pay

## 2021-06-30 DIAGNOSIS — Z1231 Encounter for screening mammogram for malignant neoplasm of breast: Secondary | ICD-10-CM | POA: Insufficient documentation

## 2021-06-30 DIAGNOSIS — Z1239 Encounter for other screening for malignant neoplasm of breast: Secondary | ICD-10-CM

## 2022-08-09 ENCOUNTER — Telehealth: Payer: Self-pay | Admitting: Obstetrics and Gynecology

## 2022-08-09 DIAGNOSIS — Z1231 Encounter for screening mammogram for malignant neoplasm of breast: Secondary | ICD-10-CM

## 2022-08-09 NOTE — Telephone Encounter (Signed)
Order placed, pt can schedule now

## 2022-08-09 NOTE — Telephone Encounter (Signed)
This pt is scheduled for her annual on 09/22/2022 with you.  She would like to go ahead and have her mammogram done.  Will you place an order so she can have this done?

## 2022-08-10 NOTE — Telephone Encounter (Signed)
Left message for pt about order for mammogram.

## 2022-08-11 ENCOUNTER — Ambulatory Visit: Payer: BC Managed Care – PPO | Admitting: Obstetrics and Gynecology

## 2022-09-12 ENCOUNTER — Ambulatory Visit
Admission: RE | Admit: 2022-09-12 | Discharge: 2022-09-12 | Disposition: A | Discharge status: Home / Self Care | Payer: BC Managed Care – PPO | Source: Ambulatory Visit | Attending: Obstetrics and Gynecology | Admitting: Obstetrics and Gynecology

## 2022-09-12 DIAGNOSIS — Z1231 Encounter for screening mammogram for malignant neoplasm of breast: Secondary | ICD-10-CM | POA: Insufficient documentation

## 2022-09-13 ENCOUNTER — Encounter: Payer: Self-pay | Admitting: Obstetrics and Gynecology

## 2022-09-22 ENCOUNTER — Ambulatory Visit (INDEPENDENT_AMBULATORY_CARE_PROVIDER_SITE_OTHER): Payer: BC Managed Care – PPO | Admitting: Obstetrics and Gynecology

## 2022-09-22 ENCOUNTER — Encounter: Payer: Self-pay | Admitting: Obstetrics and Gynecology

## 2022-09-22 VITALS — BP 110/70 | Ht 60.0 in | Wt 172.0 lb

## 2022-09-22 DIAGNOSIS — Z1211 Encounter for screening for malignant neoplasm of colon: Secondary | ICD-10-CM

## 2022-09-22 DIAGNOSIS — Z6833 Body mass index (BMI) 33.0-33.9, adult: Secondary | ICD-10-CM

## 2022-09-22 DIAGNOSIS — Z01419 Encounter for gynecological examination (general) (routine) without abnormal findings: Secondary | ICD-10-CM

## 2022-09-22 DIAGNOSIS — Z131 Encounter for screening for diabetes mellitus: Secondary | ICD-10-CM

## 2022-09-22 DIAGNOSIS — Z13220 Encounter for screening for lipoid disorders: Secondary | ICD-10-CM

## 2022-09-22 DIAGNOSIS — Z1231 Encounter for screening mammogram for malignant neoplasm of breast: Secondary | ICD-10-CM

## 2022-09-22 DIAGNOSIS — Z0000 Encounter for general adult medical examination without abnormal findings: Secondary | ICD-10-CM

## 2022-09-22 NOTE — Progress Notes (Signed)
[facility]   Chief Complaint  Patient presents with   Gynecologic Exam    No concerns    HPI:      [name] is a 47 y.o. [id number] whose LMP was Patient's last menstrual period was 08/31/2022 (approximate)., presents today for her annual examination.  Her menses are regular every 28-30 days, lasting 4-5 days, light to mod flow, occas BTB, no dysmen. Hx of PCOS, occas goes 3-4 months without menses.   Sex activity: single partner, contraception - tubal ligation. She does not have vaginal dryness.  Last Pap: 05/26/21 Results were: no abnormalities /neg HPV DNA.   Last mammogram: 09/12/22  Results were: normal--routine follow-up in 12 months There is a FH of breast cancer in her MGM and PGM, genetic testing not indicated. There is no FH of ovarian cancer. The patient does do self-breast exams.  Colonoscopy: never  Tobacco use: The patient denies current or previous tobacco use. Alcohol use: none No drug use Exercise: moderately active  She does get adequate calcium but not Vitamin D in her diet.  No recent labs.   Patient Active Problem List   Diagnosis Date Noted   PCOS (polycystic ovarian syndrome) 11/08/2016   Obesity (BMI 35.0-39.9 without comorbidity) 11/08/2016   History of gestational diabetes 11/08/2016    Past Surgical History:  Procedure Laterality Date   BREAST BIOPSY Right 05/21/2020   US bx, Q marker, path pending   CESAREAN SECTION     2003,2011   DILATION AND CURETTAGE OF UTERUS  03/2002   TUBAL LIGATION  08/2009    Family History  Problem Relation Age of Onset   Lung cancer Mother 5553   Cancer Mother        skin   Lung cancer Father    Brain cancer Father    Hypertension Maternal Uncle    Breast cancer Maternal Grandmother        60's   Melanoma Maternal Grandfather        deceased in his 3140s   Breast cancer Paternal Grandmother        early 2150's; unsure of primary-found in lung   Leukemia Paternal Grandfather [id number]     Social History   Socioeconomic History   Marital status: Married    Spouse name: Aurther Lofterry   Number of children: 2   Years of education: 16   Highest education level: Bachelor's degree (e.g., BA, AB, BS)  Occupational History   Occupation: Teacher    Comment: [employer]  Tobacco Use   Smoking status: Never   Smokeless tobacco: Never  Vaping Use   Vaping Use: Never used  Substance and Sexual Activity   Alcohol use: Yes    Comment: occasional   Drug use: No   Sexual activity: Yes    Partners: Male    Birth control/protection: Surgical    Comment: Tubal Ligation  Other Topics Concern   Not on file  Social History Narrative   Not on file   Social Determinants of Health   Financial Resource Strain: Not on file  Food Insecurity: Not on file  Transportation Needs: Not on file  Physical Activity: Not on file  Stress: Not on file  Social Connections: Not on file  Intimate Partner Violence: Not on file    No current outpatient medications on file.     ROS:  Review of Systems  Constitutional:  Negative for fatigue, fever and unexpected weight change.  Respiratory:  Negative  for cough, shortness of breath and wheezing.   Cardiovascular:  Negative for chest pain, palpitations and leg swelling.  Gastrointestinal:  Negative for blood in stool, constipation, diarrhea, nausea and vomiting.  Endocrine: Negative for cold intolerance, heat intolerance and polyuria.  Genitourinary:  Negative for dyspareunia, dysuria, flank pain, frequency, genital sores, hematuria, menstrual problem, pelvic pain, urgency, vaginal bleeding, vaginal discharge and vaginal pain.  Musculoskeletal:  Negative for back pain, joint swelling and myalgias.  Skin:  Negative for rash.  Neurological:  Negative for dizziness, syncope, light-headedness, numbness and headaches.  Hematological:  Negative for adenopathy.  Psychiatric/Behavioral:  Negative for agitation, confusion, sleep disturbance and suicidal  ideas. The patient is not nervous/anxious.    BREAST: No symptoms    Objective: BP 110/70   Ht 5' (1.524 m)   Wt 172 lb (78 kg)   LMP 08/31/2022 (Approximate)   BMI 33.59 kg/m    Physical Exam Constitutional:      Appearance: She is well-developed.  Genitourinary:     Vulva normal.     Right Labia: No rash, tenderness or lesions.    Left Labia: No tenderness, lesions or rash.    No vaginal discharge, erythema or tenderness.      Right Adnexa: not tender and no mass present.    Left Adnexa: not tender and no mass present.    No cervical friability or polyp.     Uterus is not enlarged or tender.  Breasts:    Right: No mass, nipple discharge, skin change or tenderness.     Left: No mass, nipple discharge, skin change or tenderness.  Neck:     Thyroid: No thyromegaly.  Cardiovascular:     Rate and Rhythm: Normal rate and regular rhythm.     Heart sounds: Normal heart sounds. No murmur heard. Pulmonary:     Effort: Pulmonary effort is normal.     Breath sounds: Normal breath sounds.  Abdominal:     Palpations: Abdomen is soft.     Tenderness: There is no abdominal tenderness. There is no guarding or rebound.  Musculoskeletal:        General: Normal range of motion.     Cervical back: Normal range of motion.  Lymphadenopathy:     Cervical: No cervical adenopathy.  Neurological:     General: No focal deficit present.     Mental Status: She is alert and oriented to person, place, and time.     Cranial Nerves: No cranial nerve deficit.  Skin:    General: Skin is warm and dry.  Psychiatric:        Mood and Affect: Mood normal.        Behavior: Behavior normal.        Thought Content: Thought content normal.        Judgment: Judgment normal.  Vitals reviewed.    Assessment/Plan:  Encounter for annual routine gynecological examination  Encounter for screening mammogram for malignant neoplasm of breast; pt current on mammo  PCOS--f/u if no menses Q3 months for Rx  provera.   Screening for colon cancer - Plan: Cologuard; colonoscopy/cologuard discussed. Pt elects cologuard. Ref sent. Will f/u with results.  Blood tests for routine general physical examination - Plan: Comprehensive metabolic panel, Lipid panel, Hemoglobin A1c; pt to RTO for fasting labs.  Screening cholesterol level - Plan: Lipid panel  Screening for diabetes mellitus - Plan: Hemoglobin A1c  BMI 33.0-33.9,adult - Plan: Comprehensive metabolic panel, Lipid panel, Hemoglobin A1c  GYN counsel breast self exam, mammography screening, adequate intake of calcium and vitamin D, diet and exercise    F/U  Return in about 1 year (around 09/22/2023).  Kelechi Orgeron B. Brook Geraci, PA-C 09/22/2022 4:21 PM

## 2022-09-22 NOTE — Patient Instructions (Signed)
I value your feedback and you entrusting us with your care. If you get a [place] patient survey, I would appreciate you taking the time to let us know about your experience today. Thank you! ? ? ?

## 2023-04-03 LAB — COLOGUARD: COLOGUARD: NEGATIVE

## 2023-04-03 NOTE — Progress Notes (Signed)
Pls let pt know cologuard neg and repeat due in 3 yrs. Thx

## 2023-11-23 ENCOUNTER — Telehealth: Payer: Self-pay | Admitting: Obstetrics and Gynecology

## 2023-11-23 DIAGNOSIS — Z1231 Encounter for screening mammogram for malignant neoplasm of breast: Secondary | ICD-10-CM

## 2023-11-23 NOTE — Telephone Encounter (Signed)
 Patient would like you to place an order for Mammogram.  She has an appointment scheduled for 12/18/2023.  Please call patient when this is complete.  Danielle Mitchell

## 2023-11-26 NOTE — Telephone Encounter (Signed)
Order placed, pt can schedule ?

## 2023-12-17 NOTE — Progress Notes (Unsigned)
 PCP: [facility]   No chief complaint on file.   HPI:      [name] is a 48 y.o. [id number] whose LMP was No LMP recorded., presents today for her annual examination.  Her menses are regular every 28-30 days, lasting 4-5 days, light to mod flow, occas BTB, no dysmen. Hx of PCOS, occas goes 3-4 months without menses.   Sex activity: single partner, contraception - tubal ligation. She does not have vaginal dryness.  Last Pap: 05/26/21 Results were: no abnormalities /neg HPV DNA.   Last mammogram: 09/12/22  Results were: normal--routine follow-up in 12 months There is a FH of breast cancer in her MGM and PGM, genetic testing not indicated. There is no FH of ovarian cancer. The patient does do self-breast exams.  Colonoscopy: never; NEG Cologuard 10/24, repeat due after 3 yrs  Tobacco use: The patient denies current or previous tobacco use. Alcohol use: none No drug use Exercise: moderately active  She does get adequate calcium but not Vitamin D in her diet.  No recent labs.   Patient Active Problem List   Diagnosis Date Noted   PCOS (polycystic ovarian syndrome) 11/08/2016   Obesity (BMI 35.0-39.9 without comorbidity) 11/08/2016   History of gestational diabetes 11/08/2016    Past Surgical History:  Procedure Laterality Date   BREAST BIOPSY Right 05/21/2020   US  bx, Q marker, path pending   CESAREAN SECTION     2003,2011   DILATION AND CURETTAGE OF UTERUS  03/2002   TUBAL LIGATION  08/2009    Family History  Problem Relation Age of Onset   Lung cancer Mother 50   Cancer Mother        skin   Lung cancer Father    Brain cancer Father    Hypertension Maternal Uncle    Breast cancer Maternal Grandmother        60's   Melanoma Maternal Grandfather        deceased in his 69s   Breast cancer Paternal Grandmother        early 82's; unsure of primary-found in lung   Leukemia Paternal Grandfather 30    Social History   Socioeconomic History    Marital status: Married    Spouse name: Jerel   Number of children: 2   Years of education: 16   Highest education level: Bachelor's degree (e.g., BA, AB, BS)  Occupational History   Occupation: Teacher    Comment: [employer]  Tobacco Use   Smoking status: Never   Smokeless tobacco: Never  Vaping Use   Vaping status: Never Used  Substance and Sexual Activity   Alcohol use: Yes    Comment: occasional   Drug use: No   Sexual activity: Yes    Partners: Male    Birth control/protection: Surgical    Comment: Tubal Ligation  Other Topics Concern   Not on file  Social History Narrative   Not on file   Social Drivers of Health   Financial Resource Strain: Not on file  Food Insecurity: Not on file  Transportation Needs: Not on file  Physical Activity: Not on file  Stress: Not on file  Social Connections: Not on file  Intimate Partner Violence: Not on file    No current outpatient medications on file.     ROS:  Review of Systems  Constitutional:  Negative for fatigue, fever and unexpected weight change.  Respiratory:  Negative for cough, shortness of breath and wheezing.  Cardiovascular:  Negative for chest pain, palpitations and leg swelling.  Gastrointestinal:  Negative for blood in stool, constipation, diarrhea, nausea and vomiting.  Endocrine: Negative for cold intolerance, heat intolerance and polyuria.  Genitourinary:  Negative for dyspareunia, dysuria, flank pain, frequency, genital sores, hematuria, menstrual problem, pelvic pain, urgency, vaginal bleeding, vaginal discharge and vaginal pain.  Musculoskeletal:  Negative for back pain, joint swelling and myalgias.  Skin:  Negative for rash.  Neurological:  Negative for dizziness, syncope, light-headedness, numbness and headaches.  Hematological:  Negative for adenopathy.  Psychiatric/Behavioral:  Negative for agitation, confusion, sleep disturbance and suicidal ideas. The patient is not nervous/anxious.     BREAST: No symptoms    Objective: There were no vitals taken for this visit.   Physical Exam Constitutional:      Appearance: She is well-developed.  Genitourinary:     Vulva normal.     Right Labia: No rash, tenderness or lesions.    Left Labia: No tenderness, lesions or rash.    No vaginal discharge, erythema or tenderness.      Right Adnexa: not tender and no mass present.    Left Adnexa: not tender and no mass present.    No cervical friability or polyp.     Uterus is not enlarged or tender.  Breasts:    Right: No mass, nipple discharge, skin change or tenderness.     Left: No mass, nipple discharge, skin change or tenderness.  Neck:     Thyroid: No thyromegaly.  Cardiovascular:     Rate and Rhythm: Normal rate and regular rhythm.     Heart sounds: Normal heart sounds. No murmur heard. Pulmonary:     Effort: Pulmonary effort is normal.     Breath sounds: Normal breath sounds.  Abdominal:     Palpations: Abdomen is soft.     Tenderness: There is no abdominal tenderness. There is no guarding or rebound.  Musculoskeletal:        General: Normal range of motion.     Cervical back: Normal range of motion.  Lymphadenopathy:     Cervical: No cervical adenopathy.  Neurological:     General: No focal deficit present.     Mental Status: She is alert and oriented to person, place, and time.     Cranial Nerves: No cranial nerve deficit.  Skin:    General: Skin is warm and dry.  Psychiatric:        Mood and Affect: Mood normal.        Behavior: Behavior normal.        Thought Content: Thought content normal.        Judgment: Judgment normal.  Vitals reviewed.    Assessment/Plan:  Encounter for annual routine gynecological examination  Encounter for screening mammogram for malignant neoplasm of breast; pt current on mammo  PCOS--f/u if no menses Q3 months for Rx provera.   Screening for colon cancer - Plan: Cologuard; colonoscopy/cologuard discussed. Pt elects  cologuard. Ref sent. Will f/u with results.  Blood tests for routine general physical examination - Plan: Comprehensive metabolic panel, Lipid panel, Hemoglobin A1c; pt to RTO for fasting labs.  Screening cholesterol level - Plan: Lipid panel  Screening for diabetes mellitus - Plan: Hemoglobin A1c  BMI 33.0-33.9,adult - Plan: Comprehensive metabolic panel, Lipid panel, Hemoglobin A1c          GYN counsel breast self exam, mammography screening, adequate intake of calcium and vitamin D, diet and exercise    F/U  No  follow-ups on file.  Clem Wisenbaker B. Elayne Gruver, PA-C 12/17/2023 9:51 PM

## 2023-12-18 ENCOUNTER — Ambulatory Visit (INDEPENDENT_AMBULATORY_CARE_PROVIDER_SITE_OTHER): Payer: Self-pay | Admitting: Obstetrics and Gynecology

## 2023-12-18 ENCOUNTER — Encounter: Payer: Self-pay | Admitting: Obstetrics and Gynecology

## 2023-12-18 VITALS — BP 143/75 | HR 90 | Ht 60.0 in | Wt 179.0 lb

## 2023-12-18 DIAGNOSIS — Z0000 Encounter for general adult medical examination without abnormal findings: Secondary | ICD-10-CM

## 2023-12-18 DIAGNOSIS — Z1231 Encounter for screening mammogram for malignant neoplasm of breast: Secondary | ICD-10-CM

## 2023-12-18 DIAGNOSIS — Z01419 Encounter for gynecological examination (general) (routine) without abnormal findings: Secondary | ICD-10-CM

## 2023-12-18 DIAGNOSIS — Z1329 Encounter for screening for other suspected endocrine disorder: Secondary | ICD-10-CM

## 2023-12-18 DIAGNOSIS — Z131 Encounter for screening for diabetes mellitus: Secondary | ICD-10-CM

## 2023-12-18 DIAGNOSIS — N912 Amenorrhea, unspecified: Secondary | ICD-10-CM

## 2023-12-18 DIAGNOSIS — Z13220 Encounter for screening for lipoid disorders: Secondary | ICD-10-CM

## 2023-12-18 NOTE — Patient Instructions (Addendum)
 I value your feedback and you entrusting [place] with your care. If you get a [facility] patient survey, I would appreciate you taking the time to let us know about your experience today. Thank you!  [facility] ([place])--[phone]

## 2023-12-28 ENCOUNTER — Ambulatory Visit
Admission: RE | Admit: 2023-12-28 | Discharge: 2023-12-28 | Disposition: A | Discharge status: Home / Self Care | Source: Ambulatory Visit | Attending: Obstetrics and Gynecology | Admitting: Obstetrics and Gynecology

## 2023-12-28 DIAGNOSIS — Z1231 Encounter for screening mammogram for malignant neoplasm of breast: Secondary | ICD-10-CM | POA: Insufficient documentation

## 2024-01-01 ENCOUNTER — Other Ambulatory Visit: Payer: Self-pay | Admitting: Obstetrics and Gynecology

## 2024-01-01 ENCOUNTER — Ambulatory Visit: Payer: Self-pay | Admitting: Obstetrics and Gynecology

## 2024-01-01 DIAGNOSIS — R928 Other abnormal and inconclusive findings on diagnostic imaging of breast: Secondary | ICD-10-CM

## 2024-01-04 ENCOUNTER — Ambulatory Visit
Admission: RE | Admit: 2024-01-04 | Discharge: 2024-01-04 | Disposition: A | Discharge status: Home / Self Care | Source: Ambulatory Visit | Attending: Obstetrics and Gynecology | Admitting: Obstetrics and Gynecology

## 2024-01-04 DIAGNOSIS — R928 Other abnormal and inconclusive findings on diagnostic imaging of breast: Secondary | ICD-10-CM

## 2024-01-07 ENCOUNTER — Ambulatory Visit: Payer: Self-pay | Admitting: Obstetrics and Gynecology
